# Patient Record
Sex: Male | Born: 1980 | Race: Black or African American | Hispanic: No | Marital: Single | Smoking: Current every day smoker
Health system: Southern US, Community
[De-identification: ages and names within clinical notes are randomized; demographics above are authoritative.]

## PROBLEM LIST (undated history)

## (undated) DIAGNOSIS — I10 Essential (primary) hypertension: Secondary | ICD-10-CM

## (undated) DIAGNOSIS — E119 Type 2 diabetes mellitus without complications: Secondary | ICD-10-CM

## (undated) HISTORY — PX: ANKLE SURGERY: SHX546

---

## 2007-07-12 ENCOUNTER — Emergency Department: Payer: Self-pay | Admitting: Emergency Medicine

## 2008-06-16 ENCOUNTER — Emergency Department: Payer: Self-pay | Admitting: Emergency Medicine

## 2008-09-26 ENCOUNTER — Encounter: Payer: Self-pay | Admitting: Orthopedic Surgery

## 2008-09-30 ENCOUNTER — Encounter: Payer: Self-pay | Admitting: Orthopedic Surgery

## 2008-10-30 ENCOUNTER — Encounter: Payer: Self-pay | Admitting: Orthopedic Surgery

## 2008-11-24 ENCOUNTER — Emergency Department: Payer: Self-pay | Admitting: Emergency Medicine

## 2008-11-30 ENCOUNTER — Encounter: Payer: Self-pay | Admitting: Orthopedic Surgery

## 2009-08-07 ENCOUNTER — Emergency Department: Payer: Self-pay | Admitting: Emergency Medicine

## 2010-09-30 ENCOUNTER — Emergency Department: Payer: Self-pay | Admitting: Emergency Medicine

## 2010-12-09 ENCOUNTER — Emergency Department: Payer: Self-pay | Admitting: Internal Medicine

## 2011-01-17 ENCOUNTER — Emergency Department: Payer: Self-pay | Admitting: Emergency Medicine

## 2011-01-18 ENCOUNTER — Emergency Department: Payer: Self-pay | Admitting: Emergency Medicine

## 2013-07-28 ENCOUNTER — Ambulatory Visit: Payer: Self-pay

## 2013-08-23 ENCOUNTER — Emergency Department: Payer: Self-pay | Admitting: Emergency Medicine

## 2015-06-03 ENCOUNTER — Emergency Department: Payer: Self-pay

## 2015-06-03 ENCOUNTER — Encounter: Payer: Self-pay | Admitting: Emergency Medicine

## 2015-06-03 ENCOUNTER — Emergency Department
Admission: EM | Admit: 2015-06-03 | Discharge: 2015-06-03 | Disposition: A | Discharge status: Home / Self Care | Payer: Self-pay | Attending: Emergency Medicine | Admitting: Emergency Medicine

## 2015-06-03 DIAGNOSIS — Z720 Tobacco use: Secondary | ICD-10-CM | POA: Insufficient documentation

## 2015-06-03 DIAGNOSIS — I10 Essential (primary) hypertension: Secondary | ICD-10-CM | POA: Insufficient documentation

## 2015-06-03 DIAGNOSIS — M25471 Effusion, right ankle: Secondary | ICD-10-CM | POA: Insufficient documentation

## 2015-06-03 DIAGNOSIS — M25571 Pain in right ankle and joints of right foot: Secondary | ICD-10-CM | POA: Insufficient documentation

## 2015-06-03 HISTORY — DX: Essential (primary) hypertension: I10

## 2015-06-03 MED ORDER — OXYCODONE-ACETAMINOPHEN 5-325 MG PO TABS
ORAL_TABLET | ORAL | Status: AC
  Administered 2015-06-03: 1 via ORAL
  Filled 2015-06-03: qty 1

## 2015-06-03 MED ORDER — OXYCODONE-ACETAMINOPHEN 5-325 MG PO TABS: 1.0000 | ORAL_TABLET | ORAL | Status: DC | PRN

## 2015-06-03 MED ORDER — IBUPROFEN 800 MG PO TABS: 800.0000 mg | ORAL_TABLET | Freq: Three times a day (TID) | ORAL | Status: DC | PRN

## 2015-06-03 MED ORDER — OXYCODONE-ACETAMINOPHEN 5-325 MG PO TABS
ORAL_TABLET | ORAL | Status: AC
  Filled 2015-06-03: qty 1

## 2015-06-03 MED ORDER — IBUPROFEN 800 MG PO TABS: 800.0000 mg | ORAL_TABLET | Freq: Once | ORAL | Status: DC

## 2015-06-03 MED ORDER — OXYCODONE-ACETAMINOPHEN 5-325 MG PO TABS
1.0000 | ORAL_TABLET | Freq: Once | ORAL | Status: AC
  Administered 2015-06-03: 1 via ORAL

## 2015-06-03 NOTE — ED Provider Notes (Signed)
[facility] Emergency Department Provider Note  ____________________________________________  Time seen: Approximately 6:50 AM  I have reviewed the triage vital signs and the nursing notes.   HISTORY  Chief Complaint Ankle Pain    HPI Lawrence Lea. is a 34 y.o. male who presents to the ED from home with 7/10 right ankle pain and swelling. Patient has a history of bilateral open ankle fractures s/p 13 surgeries overall. He states that he has occasional swelling with associated pain, especially with weather changes. Patient denies recent trauma/injury.   Past Medical History  Diagnosis Date  . Hypertension   No h/o gout  There are no active problems to display for this patient.   Past Surgical History  Procedure Laterality Date  . Ankle surgery      Bilateral    Current Outpatient Rx  Name  Route  Sig  Dispense  Refill  . ibuprofen (ADVIL,MOTRIN) 800 MG tablet   Oral   Take 1 tablet (800 mg total) by mouth every 8 (eight) hours as needed for moderate pain.   15 tablet   0   . oxyCODONE-acetaminophen (ROXICET) 5-325 MG per tablet   Oral   Take 1 tablet by mouth every 4 (four) hours as needed for severe pain.   15 tablet   0     Allergies Review of patient's allergies indicates no known allergies.  No family history on file.  Social History History  Substance Use Topics  . Smoking status: Current Some Day Smoker  . Smokeless tobacco: Not on file  . Alcohol Use: No    Review of Systems Constitutional: No fever/chills Eyes: No visual changes. ENT: No sore throat. Cardiovascular: Denies chest pain. Respiratory: Denies shortness of breath. Gastrointestinal: No abdominal pain.  No nausea, no vomiting.  No diarrhea.  No constipation. Genitourinary: Negative for dysuria. Musculoskeletal: Positive for right ankle pain and swelling. Negative for back pain. Skin: Negative for rash. Neurological: Negative for headaches, focal  weakness or numbness.  10-point ROS otherwise negative.  ____________________________________________   PHYSICAL EXAM:  VITAL SIGNS: ED Triage Vitals  Enc Vitals Group     BP 06/03/15 0410 160/73 mmHg     Pulse Rate 06/03/15 0410 88     Resp 06/03/15 0410 20     Temp 06/03/15 0410 98.6 F (37 C)     Temp Source 06/03/15 0410 Oral     SpO2 06/03/15 0410 99 %     Weight 06/03/15 0410 252 lb (114.306 kg)     Height 06/03/15 0410 6' (1.829 m)     Head Cir --      Peak Flow --      Pain Score 06/03/15 0410 10     Pain Loc --      Pain Edu? --      Excl. in GC? --     Constitutional: Alert and oriented. Well appearing and in no acute distress. Eyes: Conjunctivae are normal. PERRL. EOMI. Head: Atraumatic. Nose: No congestion/rhinnorhea. Mouth/Throat: Mucous membranes are moist.  Oropharynx non-erythematous. Neck: No stridor.   Cardiovascular: Normal rate, regular rhythm. Grossly normal heart sounds.  Good peripheral circulation. Respiratory: Normal respiratory effort.  No retractions. Lungs CTAB. Gastrointestinal: Soft and nontender. No distention. No abdominal bruits. No CVA tenderness. Musculoskeletal: Right lateral malleolus with mild swelling. Tender to palpation. Limited range of motion due to pain. There is no warmth or erythema over affected area. Pedal pulses intact. Brisk capillary refill.  Neurologic:  Normal speech  and language. No gross focal neurologic deficits are appreciated. Speech is normal. No gait instability. Skin:  Skin is warm, dry and intact. No rash noted. Psychiatric: Mood and affect are normal. Speech and behavior are normal.  ____________________________________________   LABS (all labs ordered are listed, but only abnormal results are displayed)  Labs Reviewed - No data to display ____________________________________________  EKG  None ____________________________________________  RADIOLOGY  Right ankle x-rays (viewed by me, interpreted  by Dr. Grace IsaacWatts): 1. Soft tissue swelling without acute osseous abnormality. 2. Distal fibula and syndesmosis fixation with tibiotalar arthrodesis. ____________________________________________   PROCEDURES  Procedure(s) performed: None  Critical Care performed: No  ____________________________________________   INITIAL IMPRESSION / ASSESSMENT AND PLAN / ED COURSE  Pertinent labs & imaging results that were available during my care of the patient were reviewed by me and considered in my medical decision making (see chart for details).  34 year old male who presents with right ankle pain and swelling without associated injury. Plan for Ace wrap, analgesia, crutches, RICE and follow-up orthopedics. Strict return precautions given. Patient verbalizes understanding and agrees with plan of care. ____________________________________________   FINAL CLINICAL IMPRESSION(S) / ED DIAGNOSES  Final diagnoses:  Pain and swelling of ankle, right      Irean HongJade J Sung, MD 06/03/15 0740

## 2015-06-03 NOTE — Discharge Instructions (Signed)
1. Take pain medicines as needed (Motrin/Percocet #15). 2. Elevate affected area and apply ice as needed for swelling. You may remove Ace wrap to bathe and sleep. 3. Use crutches as needed to walk. 4. Return to the ER for worsening symptoms, increased pain, swelling or other concerns.  Ankle Pain Ankle pain is a common symptom. The bones, cartilage, tendons, and muscles of the ankle joint perform a lot of work each day. The ankle joint holds your body weight and allows you to move around. Ankle pain can occur on either side or back of 1 or both ankles. Ankle pain may be sharp and burning or dull and aching. There may be tenderness, stiffness, redness, or warmth around the ankle. The pain occurs more often when a person walks or puts pressure on the ankle. CAUSES  There are many reasons ankle pain can develop. It is important to work with your caregiver to identify the cause since many conditions can impact the bones, cartilage, muscles, and tendons. Causes for ankle pain include:  Injury, including a break (fracture), sprain, or strain often due to a fall, sports, or a high-impact activity.  Swelling (inflammation) of a tendon (tendonitis).  Achilles tendon rupture.  Ankle instability after repeated sprains and strains.  Poor foot alignment.  Pressure on a nerve (tarsal tunnel syndrome).  Arthritis in the ankle or the lining of the ankle.  Crystal formation in the ankle (gout or pseudogout). DIAGNOSIS  A diagnosis is based on your medical history, your symptoms, results of your physical exam, and results of diagnostic tests. Diagnostic tests may include X-ray exams or a computerized magnetic scan (magnetic resonance imaging, MRI). TREATMENT  Treatment will depend on the cause of your ankle pain and may include:  Keeping pressure off the ankle and limiting activities.  Using crutches or other walking support (a cane or brace).  Using rest, ice, compression, and  elevation.  Participating in physical therapy or home exercises.  Wearing shoe inserts or special shoes.  Losing weight.  Taking medications to reduce pain or swelling or receiving an injection.  Undergoing surgery. HOME CARE INSTRUCTIONS   Only take over-the-counter or prescription medicines for pain, discomfort, or fever as directed by your caregiver.  Put ice on the injured area.  Put ice in a plastic bag.  Place a towel between your skin and the bag.  Leave the ice on for 15-20 minutes at a time, 03-04 times a day.  Keep your leg raised (elevated) when possible to lessen swelling.  Avoid activities that cause ankle pain.  Follow specific exercises as directed by your caregiver.  Record how often you have ankle pain, the location of the pain, and what it feels like. This information may be helpful to you and your caregiver.  Ask your caregiver about returning to work or sports and whether you should drive.  Follow up with your caregiver for further examination, therapy, or testing as directed. SEEK MEDICAL CARE IF:   Pain or swelling continues or worsens beyond 1 week.  You have an oral temperature above 102 F (38.9 C).  You are feeling unwell or have chills.  You are having an increasingly difficult time with walking.  You have loss of sensation or other new symptoms.  You have questions or concerns. MAKE SURE YOU:   Understand these instructions.  Will watch your condition.  Will get help right away if you are not doing well or get worse. Document Released: 05/06/2010 Document Revised: 02/08/2012 Document Reviewed:  05/06/2010 [employer] Patient Information 2015 [place], [place]. This information is not intended to replace advice given to you by your health care provider. Make sure you discuss any questions you have with your health care provider.

## 2015-06-03 NOTE — ED Notes (Signed)
Patient reports history of swelling and pain to bilateral ankles after "shattering" both ankles in a fall several years prior.  Patient reports pain and swelling to left ankle has been unbearable over the past several days.  Swelling noted to left ankle, good positive pedal pulse to left foot.

## 2015-06-03 NOTE — ED Notes (Signed)
Pt states he has hx of leg fx and will occasionally have swelling to right leg. Pt states worse pain and swelling over last few days, denies injury. pt ambulatory.

## 2015-07-01 ENCOUNTER — Encounter: Payer: Self-pay | Admitting: Emergency Medicine

## 2015-07-01 ENCOUNTER — Emergency Department
Admission: EM | Admit: 2015-07-01 | Discharge: 2015-07-01 | Disposition: A | Discharge status: Home / Self Care | Payer: BLUE CROSS/BLUE SHIELD | Attending: Emergency Medicine | Admitting: Emergency Medicine

## 2015-07-01 ENCOUNTER — Emergency Department: Payer: BLUE CROSS/BLUE SHIELD

## 2015-07-01 DIAGNOSIS — L03115 Cellulitis of right lower limb: Secondary | ICD-10-CM | POA: Diagnosis not present

## 2015-07-01 DIAGNOSIS — I10 Essential (primary) hypertension: Secondary | ICD-10-CM | POA: Insufficient documentation

## 2015-07-01 DIAGNOSIS — L02415 Cutaneous abscess of right lower limb: Secondary | ICD-10-CM | POA: Diagnosis present

## 2015-07-01 DIAGNOSIS — Z87891 Personal history of nicotine dependence: Secondary | ICD-10-CM | POA: Diagnosis not present

## 2015-07-01 DIAGNOSIS — L0291 Cutaneous abscess, unspecified: Secondary | ICD-10-CM

## 2015-07-01 LAB — BASIC METABOLIC PANEL
Anion gap: 11 (ref 5–15)
BUN: 11 mg/dL (ref 6–20)
CO2: 25 mmol/L (ref 22–32)
CREATININE: 1.02 mg/dL (ref 0.61–1.24)
Calcium: 9.3 mg/dL (ref 8.9–10.3)
Chloride: 99 mmol/L — ABNORMAL LOW (ref 101–111)
GFR calc Af Amer: >60 mL/min (ref >60)
GFR calc non Af Amer: >60 mL/min (ref >60)
GLUCOSE: 333 mg/dL — AB (ref 65–99)
Potassium: 3.9 mmol/L (ref 3.5–5.1)
Sodium: 135 mmol/L (ref 135–145)

## 2015-07-01 LAB — CBC
HCT: 43.9 % (ref 40.0–52.0)
Hemoglobin: 14.6 g/dL (ref 13.0–18.0)
MCH: 29.4 pg (ref 26.0–34.0)
MCHC: 33.2 g/dL (ref 32.0–36.0)
MCV: 88.6 fL (ref 80.0–100.0)
Platelets: 225 10*3/uL (ref 150–440)
RBC: 4.96 MIL/uL (ref 4.40–5.90)
RDW: 13.6 % (ref 11.5–14.5)
WBC: 9.5 10*3/uL (ref 3.8–10.6)

## 2015-07-01 LAB — SEDIMENTATION RATE: Sed Rate: 11 mm/hr (ref 0–15)

## 2015-07-01 MED ORDER — CLINDAMYCIN HCL 150 MG PO CAPS
300.0000 mg | ORAL_CAPSULE | Freq: Once | ORAL | Status: AC
  Administered 2015-07-01: 300 mg via ORAL
  Filled 2015-07-01: qty 2

## 2015-07-01 MED ORDER — CLINDAMYCIN HCL 300 MG PO CAPS: 300.0000 mg | ORAL_CAPSULE | Freq: Three times a day (TID) | ORAL | Status: AC

## 2015-07-01 MED ORDER — OXYCODONE-ACETAMINOPHEN 5-325 MG PO TABS: 1.0000 | ORAL_TABLET | Freq: Four times a day (QID) | ORAL | Status: AC | PRN

## 2015-07-01 NOTE — ED Notes (Signed)
Patient with abscess to right ankle. Patient states that started 3 days ago and has become larger. Area warm to touch.

## 2015-07-01 NOTE — ED Provider Notes (Signed)
[facility] Emergency Department Provider Note  ____________________________________________  Time seen: Approximately 600 AM  I have reviewed the triage vital signs and the nursing notes.   HISTORY  Chief Complaint Abscess    HPI Lawrence Richards. is a 34 y.o. male who reports that he had an abscess show up on his right lateral ankle approximately 3 days ago. The patient reports that he had surgery on his bilateral ankles and 2009. He denies any injury or trauma. He reports that it just started having some pain and swelling. He reports the pain as a 10 out of 10 in intensity. The patient reports that this is been going on for 3 days and he is unable to tolerate any more. He has not taken anything else for pain at home. He does have a mild headache.   Past Medical History  Diagnosis Date  . Hypertension     There are no active problems to display for this patient.   Past Surgical History  Procedure Laterality Date  . Ankle surgery      Bilateral    Current Outpatient Rx  Name  Route  Sig  Dispense  Refill  . clindamycin (CLEOCIN) 300 MG capsule   Oral   Take 1 capsule (300 mg total) by mouth 3 (three) times daily.   30 capsule   0   . oxyCODONE-acetaminophen (ROXICET) 5-325 MG per tablet   Oral   Take 1 tablet by mouth every 6 (six) hours as needed.   12 tablet   0     Allergies Review of patient's allergies indicates no known allergies.  History reviewed. No pertinent family history.  Social History History  Substance Use Topics  . Smoking status: Former Games developer  . Smokeless tobacco: Not on file  . Alcohol Use: No    Review of Systems Constitutional: No fever/chills Eyes: No visual changes. ENT: No sore throat. Cardiovascular: Denies chest pain. Respiratory: Denies shortness of breath. Gastrointestinal: No abdominal pain.  No nausea, no vomiting.  No diarrhea.  No constipation. Genitourinary: Negative for  dysuria. Musculoskeletal:right ankle pain Skin: erythema to right ankle and swelling to right lateral ankle Neurological: Negative for headaches, focal weakness or numbness.  10-point ROS otherwise negative.  ____________________________________________   PHYSICAL EXAM:  VITAL SIGNS: ED Triage Vitals  Enc Vitals Group     BP 07/01/15 0116 149/95 mmHg     Pulse Rate 07/01/15 0116 108     Resp 07/01/15 0116 18     Temp 07/01/15 0116 97.9 F (36.6 C)     Temp Source 07/01/15 0116 Oral     SpO2 07/01/15 0116 97 %     Weight 07/01/15 0116 253 lb (114.76 kg)     Height 07/01/15 0116 5\' 11"  (1.803 m)     Head Cir --      Peak Flow --      Pain Score 07/01/15 0117 9     Pain Loc --      Pain Edu? --      Excl. in GC? --     Constitutional: Alert and oriented. Well appearing and in mild distress. Eyes: Conjunctivae are normal. PERRL. EOMI. Head: Atraumatic. Nose: No congestion/rhinnorhea. Mouth/Throat: Mucous membranes are moist.  Oropharynx non-erythematous. Cardiovascular: Normal rate, regular rhythm. Grossly normal heart sounds.  Good peripheral circulation. Respiratory: Normal respiratory effort.  No retractions. Lungs CTAB. Gastrointestinal: Soft and nontender. No distention. Positive bowel sounds Genitourinary: deferred Musculoskeletal: No lower extremity tenderness nor  edema.  No joint effusions. Neurologic:  Normal speech and language. No gross focal neurologic deficits are appreciated. No gait instability. Skin:  Erythema and swelling to right lateral ankle Psychiatric: Mood and affect are normal.   ____________________________________________   LABS (all labs ordered are listed, but only abnormal results are displayed)  Labs Reviewed  CBC  BASIC METABOLIC PANEL  SEDIMENTATION RATE   ____________________________________________  EKG  none ____________________________________________  RADIOLOGY  Ankle xray: solidly ossified right ankle arthrodesis, no  evidence of hardware failure, no fracture or dislocation ____________________________________________   PROCEDURES  Procedure(s) performed: None  Critical Care performed: No  ____________________________________________   INITIAL IMPRESSION / ASSESSMENT AND PLAN / ED COURSE  Pertinent labs & imaging results that were available during my care of the patient were reviewed by me and considered in my medical decision making (see chart for details).  This is a 34 year old male who comes in with some swelling to his right lateral ankle with a concern for possible abscess. There is an area of fluctuance but it is directly over the scar where the patient has his hardware. I will do some labs and contact orthopedic surgery.  I spoke with Dr. Lesleigh Noe who felt that the patient could be treated as a typical cellulitis with antibiotics and he should know to follow up if things are not getting better or return to the emergency department for further evaluation. I will treat the patient with clindamycin and discharge the patient home with the caveat that he will follow-up with either orthopedic surgery or return to the emergency department in 2 days should the symptoms not be improving.  Given the location of the area of fluctuance over his previous surgical scar I will not perform an incision and drainage as the concern is I am unsure how deep the area does and if it will have some connection with his joints or near his hardware. Dr. Lesleigh Noe did agree not to I&D the area and to have him follow up or return. ____________________________________________   FINAL CLINICAL IMPRESSION(S) / ED DIAGNOSES  Final diagnoses:  Cellulitis of right lower extremity  Abscess      Rebecka Apley, MD 07/01/15 [phone]

## 2015-07-01 NOTE — ED Notes (Signed)
Pt. States he noticed an abscess on lateral part of rt. Ankle.  He has swelling the circumference of rt. Ankle. Pt. Also has swelling to the left ankle.  Pt. States in 2009 had multiple surgeries on both ankles and feet due to a fall from building.

## 2015-07-01 NOTE — Discharge Instructions (Signed)

## 2015-07-06 ENCOUNTER — Emergency Department
Admission: EM | Admit: 2015-07-06 | Discharge: 2015-07-06 | Disposition: A | Discharge status: Home / Self Care | Payer: BLUE CROSS/BLUE SHIELD | Attending: Emergency Medicine | Admitting: Emergency Medicine

## 2015-07-06 DIAGNOSIS — M25571 Pain in right ankle and joints of right foot: Secondary | ICD-10-CM | POA: Insufficient documentation

## 2015-07-06 DIAGNOSIS — I10 Essential (primary) hypertension: Secondary | ICD-10-CM | POA: Diagnosis not present

## 2015-07-06 DIAGNOSIS — Z792 Long term (current) use of antibiotics: Secondary | ICD-10-CM | POA: Insufficient documentation

## 2015-07-06 DIAGNOSIS — Z87891 Personal history of nicotine dependence: Secondary | ICD-10-CM | POA: Diagnosis not present

## 2015-07-06 LAB — CBC WITH DIFFERENTIAL/PLATELET
Basophils Absolute: 0.1 10*3/uL (ref 0–0.1)
Basophils Relative: 1 %
EOS ABS: 0.2 10*3/uL (ref 0–0.7)
Eosinophils Relative: 1 %
HCT: 39.8 % — ABNORMAL LOW (ref 40.0–52.0)
Hemoglobin: 13.5 g/dL (ref 13.0–18.0)
LYMPHS PCT: 34 %
Lymphs Abs: 3.8 10*3/uL — ABNORMAL HIGH (ref 1.0–3.6)
MCH: 30.2 pg (ref 26.0–34.0)
MCHC: 34 g/dL (ref 32.0–36.0)
MCV: 88.8 fL (ref 80.0–100.0)
MONOS PCT: 10 %
Monocytes Absolute: 1.1 10*3/uL — ABNORMAL HIGH (ref 0.2–1.0)
NEUTROS ABS: 6.2 10*3/uL (ref 1.4–6.5)
Neutrophils Relative %: 54 %
Platelets: 189 10*3/uL (ref 150–440)
RBC: 4.48 MIL/uL (ref 4.40–5.90)
RDW: 13.6 % (ref 11.5–14.5)
WBC: 11.3 10*3/uL — ABNORMAL HIGH (ref 3.8–10.6)

## 2015-07-06 LAB — SEDIMENTATION RATE: SED RATE: 12 mm/h (ref 0–15)

## 2015-07-06 LAB — BASIC METABOLIC PANEL
Anion gap: 8 (ref 5–15)
BUN: 14 mg/dL (ref 6–20)
CHLORIDE: 98 mmol/L — AB (ref 101–111)
CO2: 26 mmol/L (ref 22–32)
Calcium: 8.5 mg/dL — ABNORMAL LOW (ref 8.9–10.3)
Creatinine, Ser: 1.08 mg/dL (ref 0.61–1.24)
GFR calc Af Amer: >60 mL/min (ref >60)
GFR calc non Af Amer: >60 mL/min (ref >60)
Glucose, Bld: 464 mg/dL — ABNORMAL HIGH (ref 65–99)
POTASSIUM: 3.8 mmol/L (ref 3.5–5.1)
SODIUM: 132 mmol/L — AB (ref 135–145)

## 2015-07-06 MED ORDER — IBUPROFEN 600 MG PO TABS
600.0000 mg | ORAL_TABLET | Freq: Once | ORAL | Status: AC
  Administered 2015-07-06: 600 mg via ORAL

## 2015-07-06 MED ORDER — IBUPROFEN 600 MG PO TABS
ORAL_TABLET | ORAL | Status: AC
  Administered 2015-07-06: 600 mg via ORAL
  Filled 2015-07-06: qty 1

## 2015-07-06 NOTE — ED Notes (Signed)
Patient reports right ankle pain, denies any injury.  Patient reports chronic pain and swelling in ankles from previous fractures.

## 2015-07-06 NOTE — Discharge Instructions (Signed)
Please seek medical attention for any high fevers, chest pain, shortness of breath, change in behavior, persistent vomiting, bloody stool or any other new or concerning symptoms.  Ankle Pain Ankle pain is a common symptom. The bones, cartilage, tendons, and muscles of the ankle joint perform a lot of work each day. The ankle joint holds your body weight and allows you to move around. Ankle pain can occur on either side or back of 1 or both ankles. Ankle pain may be sharp and burning or dull and aching. There may be tenderness, stiffness, redness, or warmth around the ankle. The pain occurs more often when a person walks or puts pressure on the ankle. CAUSES  There are many reasons ankle pain can develop. It is important to work with your caregiver to identify the cause since many conditions can impact the bones, cartilage, muscles, and tendons. Causes for ankle pain include:  Injury, including a break (fracture), sprain, or strain often due to a fall, sports, or a high-impact activity.  Swelling (inflammation) of a tendon (tendonitis).  Achilles tendon rupture.  Ankle instability after repeated sprains and strains.  Poor foot alignment.  Pressure on a nerve (tarsal tunnel syndrome).  Arthritis in the ankle or the lining of the ankle.  Crystal formation in the ankle (gout or pseudogout). DIAGNOSIS  A diagnosis is based on your medical history, your symptoms, results of your physical exam, and results of diagnostic tests. Diagnostic tests may include X-ray exams or a computerized magnetic scan (magnetic resonance imaging, MRI). TREATMENT  Treatment will depend on the cause of your ankle pain and may include:  Keeping pressure off the ankle and limiting activities.  Using crutches or other walking support (a cane or brace).  Using rest, ice, compression, and elevation.  Participating in physical therapy or home exercises.  Wearing shoe inserts or special shoes.  Losing  weight.  Taking medications to reduce pain or swelling or receiving an injection.  Undergoing surgery. HOME CARE INSTRUCTIONS   Only take over-the-counter or prescription medicines for pain, discomfort, or fever as directed by your caregiver.  Put ice on the injured area.  Put ice in a plastic bag.  Place a towel between your skin and the bag.  Leave the ice on for 15-20 minutes at a time, 03-04 times a day.  Keep your leg raised (elevated) when possible to lessen swelling.  Avoid activities that cause ankle pain.  Follow specific exercises as directed by your caregiver.  Record how often you have ankle pain, the location of the pain, and what it feels like. This information may be helpful to you and your caregiver.  Ask your caregiver about returning to work or sports and whether you should drive.  Follow up with your caregiver for further examination, therapy, or testing as directed. SEEK MEDICAL CARE IF:   Pain or swelling continues or worsens beyond 1 week.  You have an oral temperature above 102 F (38.9 C).  You are feeling unwell or have chills.  You are having an increasingly difficult time with walking.  You have loss of sensation or other new symptoms.  You have questions or concerns. MAKE SURE YOU:   Understand these instructions.  Will watch your condition.  Will get help right away if you are not doing well or get worse. Document Released: 05/06/2010 Document Revised: 02/08/2012 Document Reviewed: 05/06/2010 [facility] Patient Information 2015 [place], [place]. This information is not intended to replace advice given to you by your health care  provider. Make sure you discuss any questions you have with your health care provider. ° °

## 2015-07-06 NOTE — ED Provider Notes (Signed)
[facility] Emergency Department Provider Note    ____________________________________________  Time seen: 34  I have reviewed the triage vital signs and the nursing notes.   HISTORY  Chief Complaint Ankle Pain   History limited by: Not Limited   HPI Lawrence Richards. is a 34 y.o. male who presents to the emergency department today because of concern for continued right ankle pain. The patient was seen in the emergency department roughly 5 days ago. At that point he was found to have some redness and swelling over the lateral aspect of his right ankle. He does have hardware in that ankle after ankle surgery [facility]. At that time he was treated with  antibiotics. He stated since that time had a little bit of drainage from the wound. He does complain of continued pain and swelling. Denies any fevers vomiting and nausea.     Past Medical History  Diagnosis Date  . Hypertension     There are no active problems to display for this patient.   Past Surgical History  Procedure Laterality Date  . Ankle surgery      Bilateral    Current Outpatient Rx  Name  Route  Sig  Dispense  Refill  . clindamycin (CLEOCIN) 300 MG capsule   Oral   Take 1 capsule (300 mg total) by mouth 3 (three) times daily.   30 capsule   0   . oxyCODONE-acetaminophen (ROXICET) 5-325 MG per tablet   Oral   Take 1 tablet by mouth every 6 (six) hours as needed.   12 tablet   0     Allergies Review of patient's allergies indicates no known allergies.  No family history on file.  Social History History  Substance Use Topics  . Smoking status: Former Games developer  . Smokeless tobacco: Not on file  . Alcohol Use: No    Review of Systems  Constitutional: Negative for fever. Cardiovascular: Negative for chest pain. Respiratory: Negative for shortness of breath. Gastrointestinal: Negative for abdominal pain, vomiting and diarrhea. Genitourinary: Negative for  dysuria. Musculoskeletal: Negative for back pain. Skin: Negative for rash. Neurological: Negative for headaches, focal weakness or numbness.  10-point ROS otherwise negative.  ____________________________________________   PHYSICAL EXAM:  VITAL SIGNS: ED Triage Vitals  Enc Vitals Group     BP 07/06/15 0111 142/88 mmHg     Pulse Rate 07/06/15 0111 90     Resp 07/06/15 0111 20     Temp 07/06/15 0111 98.3 F (36.8 C)     Temp Source 07/06/15 0111 Oral     SpO2 07/06/15 0111 97 %     Weight 07/06/15 0111 252 lb (114.306 kg)     Height 07/06/15 0111  (1.803 m)     Head Cir --      Peak Flow --      Pain Score 07/06/15 0529 10   Constitutional: Alert and oriented. Well appearing and in no distress. Eyes: Conjunctivae are normal. PERRL. Normal extraocular movements. ENT   Head: Normocephalic and atraumatic.   Nose: No congestion/rhinnorhea.   Mouth/Throat: Mucous membranes are moist.   Neck: No stridor. Hematological/Lymphatic/Immunilogical: No cervical lymphadenopathy. Cardiovascular: Normal rate, regular rhythm.  No murmurs, rubs, or gallops. Respiratory: Normal respiratory effort without tachypnea nor retractions. Breath sounds are clear and equal bilaterally. No wheezes/rales/rhonchi. Gastrointestinal: Soft and nontender. No distention.  Genitourinary: Deferred Musculoskeletal: right lateral ankle with small area of erythema and evidence of recent discharge. Ankle itself without any apparent swelling.  No erythema or any other site around the joint. Minimal tenderness with passive range of motion. Neurovascularly intact distally. Neurologic:  Normal speech and language. No gross focal neurologic deficits are appreciated. Speech is normal.  Skin:  Skin is warm, dry and intact. No rash noted. Psychiatric: Mood and affect are normal. Speech and behavior are normal. Patient exhibits appropriate insight and  judgment.  ____________________________________________    LABS (pertinent positives/negatives)  Labs Reviewed  CBC WITH DIFFERENTIAL/PLATELET - Abnormal; Notable for the following:    WBC 11.3 (*)    HCT 39.8 (*)    Lymphs Abs 3.8 (*)    Monocytes Absolute 1.1 (*)    All other components within normal limits  BASIC METABOLIC PANEL - Abnormal; Notable for the following:    Sodium 132 (*)    Chloride 98 (*)    Glucose, Bld 464 (*)    Calcium 8.5 (*)    All other components within normal limits  SEDIMENTATION RATE     ____________________________________________   EKG  None  ____________________________________________    RADIOLOGY  None  ____________________________________________   PROCEDURES  Procedure(s) performed: None  Critical Care performed: No  ____________________________________________   INITIAL IMPRESSION / ASSESSMENT AND PLAN / ED COURSE  Pertinent labs & imaging results that were available during my care of the patient were reviewed by me and considered in my medical decision making (see chart for details).  Patient presents to the emergency department with continued right ankle pain. On exam patient has some erythema to the lateral aspect of the ankle. The rest of the ankle joint however without any erythema. He has minimal tenderness to passive range of motion. This point I have low suspicion for septic arthritis. Additionally patient does have hardware in that ankle. I did talk to on call [facility] orthopedic resident and they will try to arrange patient for an appointment next week. Discussed return precautions with the patient.  ____________________________________________   FINAL CLINICAL IMPRESSION(S) / ED DIAGNOSES  Final diagnoses:  Ankle pain, right     [facility]ine[facility] Semen, MD 07/06/15 [phone]

## 2015-07-12 ENCOUNTER — Emergency Department: Payer: BLUE CROSS/BLUE SHIELD

## 2015-07-12 ENCOUNTER — Emergency Department
Admission: EM | Admit: 2015-07-12 | Discharge: 2015-07-12 | Disposition: A | Discharge status: Home / Self Care | Payer: BLUE CROSS/BLUE SHIELD | Attending: Emergency Medicine | Admitting: Emergency Medicine

## 2015-07-12 ENCOUNTER — Encounter: Payer: Self-pay | Admitting: Emergency Medicine

## 2015-07-12 DIAGNOSIS — R739 Hyperglycemia, unspecified: Secondary | ICD-10-CM

## 2015-07-12 DIAGNOSIS — E1165 Type 2 diabetes mellitus with hyperglycemia: Secondary | ICD-10-CM | POA: Diagnosis present

## 2015-07-12 DIAGNOSIS — I10 Essential (primary) hypertension: Secondary | ICD-10-CM | POA: Diagnosis not present

## 2015-07-12 DIAGNOSIS — X58XXXD Exposure to other specified factors, subsequent encounter: Secondary | ICD-10-CM | POA: Diagnosis not present

## 2015-07-12 DIAGNOSIS — Z792 Long term (current) use of antibiotics: Secondary | ICD-10-CM | POA: Insufficient documentation

## 2015-07-12 DIAGNOSIS — Z87891 Personal history of nicotine dependence: Secondary | ICD-10-CM | POA: Diagnosis not present

## 2015-07-12 DIAGNOSIS — S91001D Unspecified open wound, right ankle, subsequent encounter: Secondary | ICD-10-CM | POA: Insufficient documentation

## 2015-07-12 HISTORY — DX: Type 2 diabetes mellitus without complications: E11.9

## 2015-07-12 LAB — BASIC METABOLIC PANEL
Anion gap: 14 (ref 5–15)
BUN: 15 mg/dL (ref 6–20)
CHLORIDE: 95 mmol/L — AB (ref 101–111)
CO2: 21 mmol/L — ABNORMAL LOW (ref 22–32)
CREATININE: 1.22 mg/dL (ref 0.61–1.24)
Calcium: 9.2 mg/dL (ref 8.9–10.3)
GFR calc Af Amer: >60 mL/min (ref >60)
GFR calc non Af Amer: >60 mL/min (ref >60)
Glucose, Bld: 466 mg/dL — ABNORMAL HIGH (ref 65–99)
Potassium: 3.9 mmol/L (ref 3.5–5.1)
Sodium: 130 mmol/L — ABNORMAL LOW (ref 135–145)

## 2015-07-12 LAB — GLUCOSE, CAPILLARY
GLUCOSE-CAPILLARY: 462 mg/dL — AB (ref 65–99)
GLUCOSE-CAPILLARY: 495 mg/dL — AB (ref 65–99)
GLUCOSE-CAPILLARY: 372 mg/dL — AB (ref 65–99)

## 2015-07-12 LAB — URINALYSIS COMPLETE WITH MICROSCOPIC (ARMC ONLY)
BACTERIA UA: NONE SEEN
Bilirubin Urine: NEGATIVE
Hgb urine dipstick: NEGATIVE
Leukocytes, UA: NEGATIVE
Nitrite: NEGATIVE
Protein, ur: NEGATIVE mg/dL
SPECIFIC GRAVITY, URINE: 1.031 — AB (ref 1.005–1.030)
Squamous Epithelial / LPF: NONE SEEN
pH: 5 (ref 5.0–8.0)

## 2015-07-12 LAB — CBC WITH DIFFERENTIAL/PLATELET
Basophils Absolute: 0.1 10*3/uL (ref 0–0.1)
Basophils Relative: 1 %
EOS PCT: 1 %
Eosinophils Absolute: 0.1 10*3/uL (ref 0–0.7)
HCT: 43.9 % (ref 40.0–52.0)
Hemoglobin: 15.1 g/dL (ref 13.0–18.0)
LYMPHS ABS: 2.4 10*3/uL (ref 1.0–3.6)
Lymphocytes Relative: 25 %
MCH: 29.9 pg (ref 26.0–34.0)
MCHC: 34.3 g/dL (ref 32.0–36.0)
MCV: 87 fL (ref 80.0–100.0)
Monocytes Absolute: 0.8 10*3/uL (ref 0.2–1.0)
Monocytes Relative: 9 %
Neutro Abs: 6.2 10*3/uL (ref 1.4–6.5)
Neutrophils Relative %: 64 %
Platelets: 215 10*3/uL (ref 150–440)
RBC: 5.04 MIL/uL (ref 4.40–5.90)
RDW: 13.6 % (ref 11.5–14.5)
WBC: 9.7 10*3/uL (ref 3.8–10.6)

## 2015-07-12 LAB — SEDIMENTATION RATE: Sed Rate: 13 mm/hr (ref 0–15)

## 2015-07-12 MED ORDER — INSULIN ASPART 100 UNIT/ML ~~LOC~~ SOLN
10.0000 [IU] | Freq: Once | SUBCUTANEOUS | Status: AC
  Administered 2015-07-12: 10 [IU] via INTRAVENOUS
  Filled 2015-07-12: qty 10

## 2015-07-12 MED ORDER — KETOROLAC TROMETHAMINE 30 MG/ML IJ SOLN
30.0000 mg | Freq: Once | INTRAMUSCULAR | Status: AC
  Administered 2015-07-12: 30 mg via INTRAVENOUS
  Filled 2015-07-12: qty 1

## 2015-07-12 MED ORDER — SODIUM CHLORIDE 0.9 % IV BOLUS (SEPSIS)
1000.0000 mL | Freq: Once | INTRAVENOUS | Status: AC
  Administered 2015-07-12: 1000 mL via INTRAVENOUS

## 2015-07-12 MED ORDER — CEPHALEXIN 500 MG PO CAPS
500.0000 mg | ORAL_CAPSULE | Freq: Once | ORAL | Status: AC
  Administered 2015-07-12: 500 mg via ORAL
  Filled 2015-07-12: qty 1

## 2015-07-12 NOTE — ED Provider Notes (Signed)
[facility] Emergency Department Provider Note ____________________________________________  Time seen: Approximately 5:42 PM  I have reviewed the triage vital signs and the nursing notes.   HISTORY  Chief Complaint Hyperglycemia    HPI Lawrence Richards. is a 34 y.o. male with newly diagnosed diabetes who started metformin yesterday and presents today because his blood sugar was 500 at home and he was having blurred vision. He has also been seen in the ER several times for pain and drainage of his right lateral ankle that has been previously repaired with hardware. He was started on clindamycin on 8/1 and has not yet completed that course. [facility] orthopedics was contacted on 8/6 and stated they would try to arrange a follow-up but patient states he has yet to hear from them. He has been seen at [facility] for his blood sugars and is scheduled for diabetic education on September 8. This being in the ER and receiving IV fluids he states his vision is improving. He states the ankle drainage has somewhat gone down but is still present; the pain is also still present but has increased somewhat.  He denies any fevers. No chest pain, difficulty breathing, abdominal pain, vomiting.Endorses increased thirst and urination.  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  Past Medical History  Diagnosis Date  . Hypertension   . Diabetes mellitus without complication     There are no active problems to display for this patient.   Past Surgical History  Procedure Laterality Date  . Ankle surgery      Bilateral    Current Outpatient Rx  Name  Route  Sig  Dispense  Refill  . clindamycin (CLEOCIN) 300 MG capsule   Oral   Take 1 capsule (300 mg total) by mouth 3 (three) times daily.   30 capsule   0   . oxyCODONE-acetaminophen (ROXICET) 5-325 MG per tablet   Oral   Take 1 tablet by  mouth every 6 (six) hours as needed.   12 tablet   0     Allergies Review of patient's allergies indicates no known allergies.  History reviewed. No pertinent family history.  Social History Social History  Substance Use Topics  . Smoking status: Former Games developer  . Smokeless tobacco: None  . Alcohol Use: No    Review of Systems Constitutional: No fever Cardiovascular: Denies chest pain. Respiratory: Denies shortness of breath. Gastrointestinal: No abdominal pain.  No nausea, no vomiting.  No diarrhea.   Endocrine:No recent weight change 10-point ROS otherwise negative.  ____________________________________________   PHYSICAL EXAM:  VITAL SIGNS: ED Triage Vitals  Enc Vitals Group     BP 07/12/15 1549 142/82 mmHg     Pulse Rate 07/12/15 1549 111     Resp 07/12/15 1549 20     Temp 07/12/15 1549 98.2 F (36.8 C)     Temp Source 07/12/15 1549 Oral     SpO2 07/12/15 1549 98 %     Weight 07/12/15 1549 180 lb (81.647 kg)     Height 07/12/15 1549 5\' 11"  (1.803 m)     Head Cir --      Peak Flow --      Pain Score 07/12/15 1550 0     Pain Loc --      Pain Edu? --      Excl. in GC? --    Constitutional: Alert and  oriented. Well appearing and in no acute distress. Eyes: Conjunctivae are normal. PERRL. EOMI. Head: Atraumatic. Nose: No congestion/rhinnorhea. Mouth/Throat: Mucous membranes are moist.  Oropharynx non-erythematous. Neck: No stridor.   Lymphatic: No cervical lymphadenopathy. Cardiovascular: Normal rate, regular rhythm. Grossly normal heart sounds.  Peripheral pulses 2+ B Respiratory: Normal respiratory effort.  No retractions. Lungs CTAB. Gastrointestinal: Soft and nontender. No distention. Normal bowel sounds.  Musculoskeletal: Right lateral malleolus with punctate area that has a tiny bit of purulence. There is no underlying fluctuance or swelling. There is no surrounding erythema. Significant scarring over medial malleolus. No lower extremity edema.  No  calf TTP. Neurologic:  Normal speech and language. No gross focal neurologic deficits are appreciated. Speech is normal.  Skin:  Skin is warm, dry and intact. No rash noted. Psychiatric: Mood and affect are normal. Speech and behavior are normal.  ____________________________________________   LABS (all labs ordered are listed, but only abnormal results are displayed)  Labs Reviewed  GLUCOSE, CAPILLARY - Abnormal; Notable for the following:    Glucose-Capillary 495 (*)    All other components within normal limits  GLUCOSE, CAPILLARY - Abnormal; Notable for the following:    Glucose-Capillary 462 (*)    All other components within normal limits  BASIC METABOLIC PANEL - Abnormal; Notable for the following:    Sodium 130 (*)    Chloride 95 (*)    CO2 21 (*)    Glucose, Bld 466 (*)    All other components within normal limits  URINALYSIS COMPLETEWITH MICROSCOPIC ([facility] ONLY) - Abnormal; Notable for the following:    Color, Urine STRAW (*)    APPearance CLEAR (*)    Glucose, UA >500 (*)    Ketones, ur 2+ (*)    Specific Gravity, Urine 1.031 (*)    All other components within normal limits  WOUND CULTURE  CBC WITH DIFFERENTIAL/PLATELET  SEDIMENTATION RATE   ____________________________________________  RADIOLOGY  Ankle xr-IMPRESSION: No acute fracture or subluxation. Stable postsurgical changes. Degenerative changes talocalcaneal joint.   ____________________________________________   PROCEDURES  Procedure(s) performed: none  Critical Care performed: none ____________________________________________   INITIAL IMPRESSION / ASSESSMENT AND PLAN / ED COURSE  Pertinent labs & imaging results that were available during my care of the patient were reviewed by me and considered in my medical decision making (see chart for details).  Repeat sedimentation rate and x-ray today.  ----------------------------------------- 6:58 PM on  07/12/2015 -----------------------------------------  I discussed with Dr. Towana Badger, [facility] orthopedics who will arrange for patient to be seen in orthopedics clinic for follow-up. As patient's sedimentation rate has not elevated over the course of his treatment and the area appears to be improving, I will advise him to finish his course of clindamycin and let orthopedics decide if they want to extend it.  Sugar now 360 ____________________________________________   FINAL CLINICAL IMPRESSION(S) / ED DIAGNOSES  Hyperglycemia, right lateral malleolus wound    Maurilio Lovely, MD 07/12/15 1919

## 2015-07-12 NOTE — ED Notes (Signed)
[employer] report [id number] [phone]

## 2015-07-12 NOTE — Discharge Instructions (Signed)
Go to [facility] orthopedics on Monday for follow-up of your ankle wound. Call first to find out if they have walk-in hours or if they have are arranged your appointment.  [phone].  Call [facility] on Monday to see if they can move up your appointment for your diabetic education. Return to the emergency department for new or worsening symptoms, fever, chills, vomiting, increased swelling or pus at your ankle or for any other concerns.  Be sure to finish your course of antibiotics as prescribed previously.

## 2015-07-12 NOTE — ED Notes (Signed)
Pt reports just dx with diabetes a few days ago, started metformin yesterday.  Today having blurred vision and thirty and sugar read 500 at home.

## 2015-07-16 LAB — WOUND CULTURE
CULTURE: NO GROWTH
GRAM STAIN: NONE SEEN
Special Requests: NORMAL

## 2016-05-11 ENCOUNTER — Emergency Department
Admission: EM | Admit: 2016-05-11 | Discharge: 2016-05-11 | Disposition: A | Discharge status: Home / Self Care | Payer: BLUE CROSS/BLUE SHIELD | Attending: Emergency Medicine | Admitting: Emergency Medicine

## 2016-05-11 DIAGNOSIS — E119 Type 2 diabetes mellitus without complications: Secondary | ICD-10-CM | POA: Diagnosis not present

## 2016-05-11 DIAGNOSIS — Z87891 Personal history of nicotine dependence: Secondary | ICD-10-CM | POA: Diagnosis not present

## 2016-05-11 DIAGNOSIS — I10 Essential (primary) hypertension: Secondary | ICD-10-CM | POA: Insufficient documentation

## 2016-05-11 DIAGNOSIS — M25572 Pain in left ankle and joints of left foot: Secondary | ICD-10-CM | POA: Diagnosis present

## 2016-05-11 LAB — SEDIMENTATION RATE: SED RATE: 5 mm/h (ref 0–15)

## 2016-05-11 LAB — CBC WITH DIFFERENTIAL/PLATELET
Basophils Absolute: 0.1 10*3/uL (ref 0–0.1)
Basophils Relative: 1 %
EOS ABS: 0.1 10*3/uL (ref 0–0.7)
EOS PCT: 1 %
HCT: 42.3 % (ref 40.0–52.0)
Hemoglobin: 14.3 g/dL (ref 13.0–18.0)
LYMPHS ABS: 3 10*3/uL (ref 1.0–3.6)
LYMPHS PCT: 36 %
MCH: 30.8 pg (ref 26.0–34.0)
MCHC: 33.7 g/dL (ref 32.0–36.0)
MCV: 91.2 fL (ref 80.0–100.0)
MONO ABS: 0.7 10*3/uL (ref 0.2–1.0)
MONOS PCT: 9 %
Neutro Abs: 4.4 10*3/uL (ref 1.4–6.5)
Neutrophils Relative %: 53 %
PLATELETS: 186 10*3/uL (ref 150–440)
RBC: 4.64 MIL/uL (ref 4.40–5.90)
RDW: 13.8 % (ref 11.5–14.5)
WBC: 8.3 10*3/uL (ref 3.8–10.6)

## 2016-05-11 LAB — BASIC METABOLIC PANEL
Anion gap: 7 (ref 5–15)
BUN: 16 mg/dL (ref 6–20)
CO2: 25 mmol/L (ref 22–32)
CREATININE: 1.04 mg/dL (ref 0.61–1.24)
Calcium: 8.9 mg/dL (ref 8.9–10.3)
Chloride: 107 mmol/L (ref 101–111)
GFR calc Af Amer: >60 mL/min (ref >60)
GLUCOSE: 132 mg/dL — AB (ref 65–99)
POTASSIUM: 3.6 mmol/L (ref 3.5–5.1)
SODIUM: 139 mmol/L (ref 135–145)

## 2016-05-11 NOTE — ED Notes (Signed)
Patient reports had surgery on 04/02/16 to removed "metal" from previous surgery related to fracture of ankle (left ankle).  Patient reports continue pain and that feels as though area is not healing well.  Noted open area to left lateral ankle.

## 2016-05-11 NOTE — Discharge Instructions (Signed)
Please seek medical attention for any high fevers, chest pain, shortness of breath, change in behavior, persistent vomiting, bloody stool or any other new or concerning symptoms.  Ankle Pain Ankle pain is a common symptom. The bones, cartilage, tendons, and muscles of the ankle joint perform a lot of work each day. The ankle joint holds your body weight and allows you to move around. Ankle pain can occur on either side or back of 1 or both ankles. Ankle pain may be sharp and burning or dull and aching. There may be tenderness, stiffness, redness, or warmth around the ankle. The pain occurs more often when a person walks or puts pressure on the ankle. CAUSES  There are many reasons ankle pain can develop. It is important to work with your caregiver to identify the cause since many conditions can impact the bones, cartilage, muscles, and tendons. Causes for ankle pain include:  Injury, including a break (fracture), sprain, or strain often due to a fall, sports, or a high-impact activity.  Swelling (inflammation) of a tendon (tendonitis).  Achilles tendon rupture.  Ankle instability after repeated sprains and strains.  Poor foot alignment.  Pressure on a nerve (tarsal tunnel syndrome).  Arthritis in the ankle or the lining of the ankle.  Crystal formation in the ankle (gout or pseudogout). DIAGNOSIS  A diagnosis is based on your medical history, your symptoms, results of your physical exam, and results of diagnostic tests. Diagnostic tests may include X-ray exams or a computerized magnetic scan (magnetic resonance imaging, MRI). TREATMENT  Treatment will depend on the cause of your ankle pain and may include:  Keeping pressure off the ankle and limiting activities.  Using crutches or other walking support (a cane or brace).  Using rest, ice, compression, and elevation.  Participating in physical therapy or home exercises.  Wearing shoe inserts or special shoes.  Losing  weight.  Taking medications to reduce pain or swelling or receiving an injection.  Undergoing surgery. HOME CARE INSTRUCTIONS   Only take over-the-counter or prescription medicines for pain, discomfort, or fever as directed by your caregiver.  Put ice on the injured area.  Put ice in a plastic bag.  Place a towel between your skin and the bag.  Leave the ice on for 15-20 minutes at a time, 03-04 times a day.  Keep your leg raised (elevated) when possible to lessen swelling.  Avoid activities that cause ankle pain.  Follow specific exercises as directed by your caregiver.  Record how often you have ankle pain, the location of the pain, and what it feels like. This information may be helpful to you and your caregiver.  Ask your caregiver about returning to work or sports and whether you should drive.  Follow up with your caregiver for further examination, therapy, or testing as directed. SEEK MEDICAL CARE IF:   Pain or swelling continues or worsens beyond 1 week.  You have an oral temperature above 102 F (38.9 C).  You are feeling unwell or have chills.  You are having an increasingly difficult time with walking.  You have loss of sensation or other new symptoms.  You have questions or concerns. MAKE SURE YOU:   Understand these instructions.  Will watch your condition.  Will get help right away if you are not doing well or get worse.   This information is not intended to replace advice given to you by your health care provider. Make sure you discuss any questions you have with your health care  provider.   Document Released: 05/06/2010 Document Revised: 02/08/2012 Document Reviewed: 06/18/2015 Elsevier Interactive Patient Education [employer]2016 Elsevier Inc.

## 2016-05-11 NOTE — ED Provider Notes (Signed)
[facility] Emergency Department Provider Note   ____________________________________________  Time seen: ~0435  I have reviewed the triage vital signs and the nursing notes.   HISTORY  Chief Complaint Wound Infection   History limited by: Not Limited   HPI Lawrence Richards. is a 35 y.o. male who presents to the emergency department today with continued left ankle pain. The patient had a surgery a little over  Month ago to remove plates from that ankle by [facility] orthopedics. Since then his left ankle has not completely healed. When he had his sutures removed it was worse according to the patient. He says that he was told to leave it open and to let the wound drain. He denies any fevers, nausea or vomiting.    Past Medical History  Diagnosis Date  . Hypertension   . Diabetes mellitus without complication     There are no active problems to display for this patient.   Past Surgical History  Procedure Laterality Date  . Ankle surgery      Bilateral    Current Outpatient Rx  Name  Route  Sig  Dispense  Refill  . clindamycin (CLEOCIN) 300 MG capsule   Oral   Take 1 capsule (300 mg total) by mouth 3 (three) times daily.   30 capsule   0   . oxyCODONE-acetaminophen (ROXICET) 5-325 MG per tablet   Oral   Take 1 tablet by mouth every 6 (six) hours as needed.   12 tablet   0     Allergies Review of patient's allergies indicates no known allergies.  No family history on file.  Social History Social History  Substance Use Topics  . Smoking status: Former Research scientist (life sciences)  . Smokeless tobacco: Not on file  . Alcohol Use: No    Review of Systems  Constitutional: Negative for fever. Cardiovascular: Negative for chest pain. Respiratory: Negative for shortness of breath. Gastrointestinal: Negative for abdominal pain, vomiting and diarrhea. Genitourinary: Negative for dysuria. Musculoskeletal: left ankle pain Skin: wound to left ankle.   Neurological: Negative for headaches, focal weakness or numbness.  10-point ROS otherwise negative.  ____________________________________________   PHYSICAL EXAM:  VITAL SIGNS: ED Triage Vitals  Enc Vitals Group     BP 05/11/16 0250 158/85 mmHg     Pulse Rate 05/11/16 0250 83     Resp 05/11/16 0250 20     Temp 05/11/16 0250 98.9 F (37.2 C)     Temp Source 05/11/16 0328 Oral     SpO2 05/11/16 0250 99 %     Weight 05/11/16 0250 245 lb (111.131 kg)     Height 05/11/16 0250 '5\' 11"'$  (1.803 m)     Head Cir --      Peak Flow --      Pain Score 05/11/16 0252 10   Constitutional: Alert and oriented. Well appearing and in no distress. Eyes: Conjunctivae are normal. PERRL. Normal extraocular movements. ENT   Head: Normocephalic and atraumatic.   Nose: No congestion/rhinnorhea.   Mouth/Throat: Mucous membranes are moist.   Neck: No stridor. Hematological/Lymphatic/Immunilogical: No cervical lymphadenopathy. Cardiovascular: Normal rate, regular rhythm.  No murmurs, rubs, or gallops. Respiratory: Normal respiratory effort without tachypnea nor retractions. Breath sounds are clear and equal bilaterally. No wheezes/rales/rhonchi. Gastrointestinal: Soft and nontender. No distention.  Genitourinary: Deferred Musculoskeletal: Left lateral ankle with open surgical incision wound. No drainage. No erythema or warmth. It appears to be healing appropriately.  Neurologic:  Normal speech and language. No gross focal  neurologic deficits are appreciated.  Skin:  Wound to lateral left ankle.  Psychiatric: Mood and affect are normal. Speech and behavior are normal. Patient exhibits appropriate insight and judgment.  ____________________________________________    LABS (pertinent positives/negatives)  Labs Reviewed  BASIC METABOLIC PANEL - Abnormal; Notable for the following:    Glucose, Bld 132 (*)    All other components within normal limits  CBC WITH DIFFERENTIAL/PLATELET   SEDIMENTATION RATE     ____________________________________________   EKG  None  ____________________________________________    RADIOLOGY  None  ____________________________________________   PROCEDURES  Procedure(s) performed: None  Critical Care performed: No  ____________________________________________   INITIAL IMPRESSION / ASSESSMENT AND PLAN / ED COURSE  Pertinent labs & imaging results that were available during my care of the patient were reviewed by me and considered in my medical decision making (see chart for details).  Patient presented to the emergency department today because of concerns for continued left ankle pain and wound. Patient is followed by [facility] orthopedics recently had a surgery to remove plates from that ankle. The pain has been persistent since then. The patient states that the wound actually has gotten better since when he was seen for suture removal. Blood work without any leukocytosis or elevation of ESR. Clinically does not appear infected. I did talk to the patient encourage patient to follow-up with [facility] orthopedics. Would defer antibiotics at this time.  ____________________________________________   FINAL CLINICAL IMPRESSION(S) / ED DIAGNOSES  Final diagnoses:  Ankle pain, left     Note: This dictation was prepared with Dragon dictation. Any transcriptional errors that result from this process are unintentional    Nance Pear, MD 05/11/16 [phone]

## 2016-06-30 ENCOUNTER — Emergency Department
Admission: EM | Admit: 2016-06-30 | Discharge: 2016-06-30 | Disposition: A | Discharge status: Home / Self Care | Payer: BLUE CROSS/BLUE SHIELD | Attending: Emergency Medicine | Admitting: Emergency Medicine

## 2016-06-30 ENCOUNTER — Emergency Department: Payer: BLUE CROSS/BLUE SHIELD

## 2016-06-30 DIAGNOSIS — M25572 Pain in left ankle and joints of left foot: Secondary | ICD-10-CM

## 2016-06-30 DIAGNOSIS — E119 Type 2 diabetes mellitus without complications: Secondary | ICD-10-CM | POA: Insufficient documentation

## 2016-06-30 DIAGNOSIS — M19071 Primary osteoarthritis, right ankle and foot: Secondary | ICD-10-CM

## 2016-06-30 DIAGNOSIS — M19072 Primary osteoarthritis, left ankle and foot: Secondary | ICD-10-CM | POA: Insufficient documentation

## 2016-06-30 DIAGNOSIS — G8929 Other chronic pain: Secondary | ICD-10-CM

## 2016-06-30 DIAGNOSIS — Z87891 Personal history of nicotine dependence: Secondary | ICD-10-CM | POA: Insufficient documentation

## 2016-06-30 DIAGNOSIS — I10 Essential (primary) hypertension: Secondary | ICD-10-CM | POA: Insufficient documentation

## 2016-06-30 MED ORDER — NABUMETONE 750 MG PO TABS: 750.0000 mg | ORAL_TABLET | Freq: Two times a day (BID) | ORAL | 0 refills | Status: AC

## 2016-06-30 MED ORDER — LIDOCAINE 5 % EX PTCH: 1.0000 | MEDICATED_PATCH | Freq: Two times a day (BID) | CUTANEOUS | 0 refills | Status: AC

## 2016-06-30 NOTE — ED Provider Notes (Signed)
[facility] Emergency Department Provider Note ____________________________________________  Time seen: 2114  I have reviewed the triage vital signs and the nursing notes.  HISTORY  Chief Complaint  Ankle Pain  HPI Lawrence Richards. is a 35 y.o. male presents to the ED for evaluation of his chronic left ankle pain. The patient presents today noting subtle swelling and a cystlike formation to the lateral left ankle within his surgical scar. He denies any recent injury, trauma, or accident. He was evaluated hereabout a month ago for evaluation of potential wound infection after he had his ankle fixator hardware removed after nearly 9 years. Patient with a history of diabetes and hypertension presents today for exquisite pain to the cyst like formation of his lateral ankle scar. He denies any fevers, chills, sweats in the interim.  Past Medical History:  Diagnosis Date  . Diabetes mellitus without complication   . Hypertension     There are no active problems to display for this patient.   Past Surgical History:  Procedure Laterality Date  . ANKLE SURGERY     Bilateral    Current Outpatient Rx  . Order #: [id number] Class: Print  . Order #: [id number] Class: Print  . Order #: [id number] Class: Print  . Order #: [id number] Class: Print    Allergies Review of patient's allergies indicates no known allergies.  No family history on file.  Social History Social History  Substance Use Topics  . Smoking status: Former Games developer  . Smokeless tobacco: Not on file  . Alcohol use No   Review of Systems  Constitutional: Negative for fever. Musculoskeletal: Negative for back pain. Left ankle pain and local swelling as above.  Skin: Negative for rash. Neurological: Negative for headaches, focal weakness or numbness. ____________________________________________  PHYSICAL EXAM:  VITAL SIGNS: ED Triage Vitals  Enc Vitals Group     BP 06/30/16 2016 (!) 139/96     Pulse Rate 06/30/16 2016 (!) 108     Resp 06/30/16 2016 18     Temp 06/30/16 2016 98.3 F (36.8 C)     Temp Source 06/30/16 2016 Oral     SpO2 06/30/16 2016 98 %     Weight 06/30/16 2013 240 lb (108.9 kg)     Height 06/30/16 2013 5\' 11"  (1.803 m)     Head Circumference --      Peak Flow --      Pain Score 06/30/16 2013 10     Pain Loc --      Pain Edu? --      Excl. in GC? --    Constitutional: Alert and oriented. Well appearing and in no distress. Head: Normocephalic and atraumatic. Cardiovascular: Normal rate distal pulses and cap refill Respiratory: Normal respiratory effort.  Musculoskeletal: Nontender with normal range of motion in all extremities. Right ankle with chronic surgical scar in the lateral and medial aspect without acute signs of infection, erythema, or dehiscence. The midportion of the lateral ankle surgical scar notes about a 1-1/2 cm soft, firm, nodular projection. There is no erythema, redness, or induration noted. There is no discharge, drainage, or fluctuance noted. Neurologic:  Normal gait without ataxia. Normal speech and language. No gross focal neurologic deficits are appreciated. Skin:  Skin is warm, dry and intact. No rash noted. ____________________________________________   RADIOLOGY  Right ankle  IMPRESSION: Chronic changes of the distal tibia/ fibula and ankle mortise as Described. Mild irregularity, residual hardware debris and hardware tracts along the surface of the mid to distal  fibula. No acute fracture and no bone destruction to suggest Osteomyelitis.  I, Shaterria Sager, Charlesetta Ivory, personally viewed and evaluated these images (plain radiographs) as part of my medical decision making, as well as reviewing the written report by the radiologist. ____________________________________________  PROCEDURES  Hydrocolloid pad applied for skin protection ____________________________________________  INITIAL IMPRESSION / ASSESSMENT AND PLAN / ED  COURSE  Patient with a chronic left ankle pain following remote ORIF and recent hardware removal. He has pain secondary to fusion of his ankle joint. He also has some residual metallic hardware debris in the subcutaneous tissues as noted on x-ray, which may represent some subcutaneous cyst or seroma. No evidence of acute cellulitic changes on exam. Patient is discharged with a prescription for Lidoderm patches and Relafen to dose as directed. He is encouraged to up dose his Gabapentin to 300 mg TID. He will follow-up with [facility] for further management, including custom orthotics or rocker-bottom shoe caps for decreased ankle mobility.   Clinical Course   ____________________________________________  FINAL CLINICAL IMPRESSION(S) / ED DIAGNOSES  Final diagnoses:  Chronic ankle pain, left  DJD (degenerative joint disease), ankle and foot, right     Lissa Hoard, PA-C 06/30/16 2357    Sharyn Creamer, MD 07/01/16 0028

## 2016-06-30 NOTE — ED Triage Notes (Signed)
Pt had hardware place to right ankle in 2009 after fracture, states had hardware removed may 4th due to abscess. States has not had problems since but now has increased swelling to pain to outer aspect of right ankle. Pt denies any obvious injury or cause.

## 2016-06-30 NOTE — ED Notes (Addendum)
Pt had hardware place to right ankle in 2009 after fracture noted scar from surgery,  Pt states had hardware removed may 4th due to abscess. States has not had problems since but yesterday started having increased swelling and pain to outer lateral side  of right ankle. Pt states pain has radiated up in to whole leg now. pain is 10/10. Severe pain to the touch of ankle. Pt has good pulses, pt able to move toes, and has good cap refill. Pt denies any new injury.

## 2016-06-30 NOTE — Discharge Instructions (Signed)
Take the prescription meds as directed. Consider compression socks and custom shoe fittings as discussed. Follow-up with [facility] for further management.

## 2016-10-12 ENCOUNTER — Ambulatory Visit: Payer: Self-pay | Admitting: Surgery

## 2016-11-15 IMAGING — CR DG ANKLE COMPLETE 3+V*R*
1 series · 3 of 3 positions shown · non-contrast
Comparison: 06/03/2015

CLINICAL DATA: Right ankle swelling.

EXAM:
RIGHT ANKLE - COMPLETE 3+ VIEW

[Series 1: ap · 0.17mm/px · 3 of 3 slices shown]
[im 1/3]
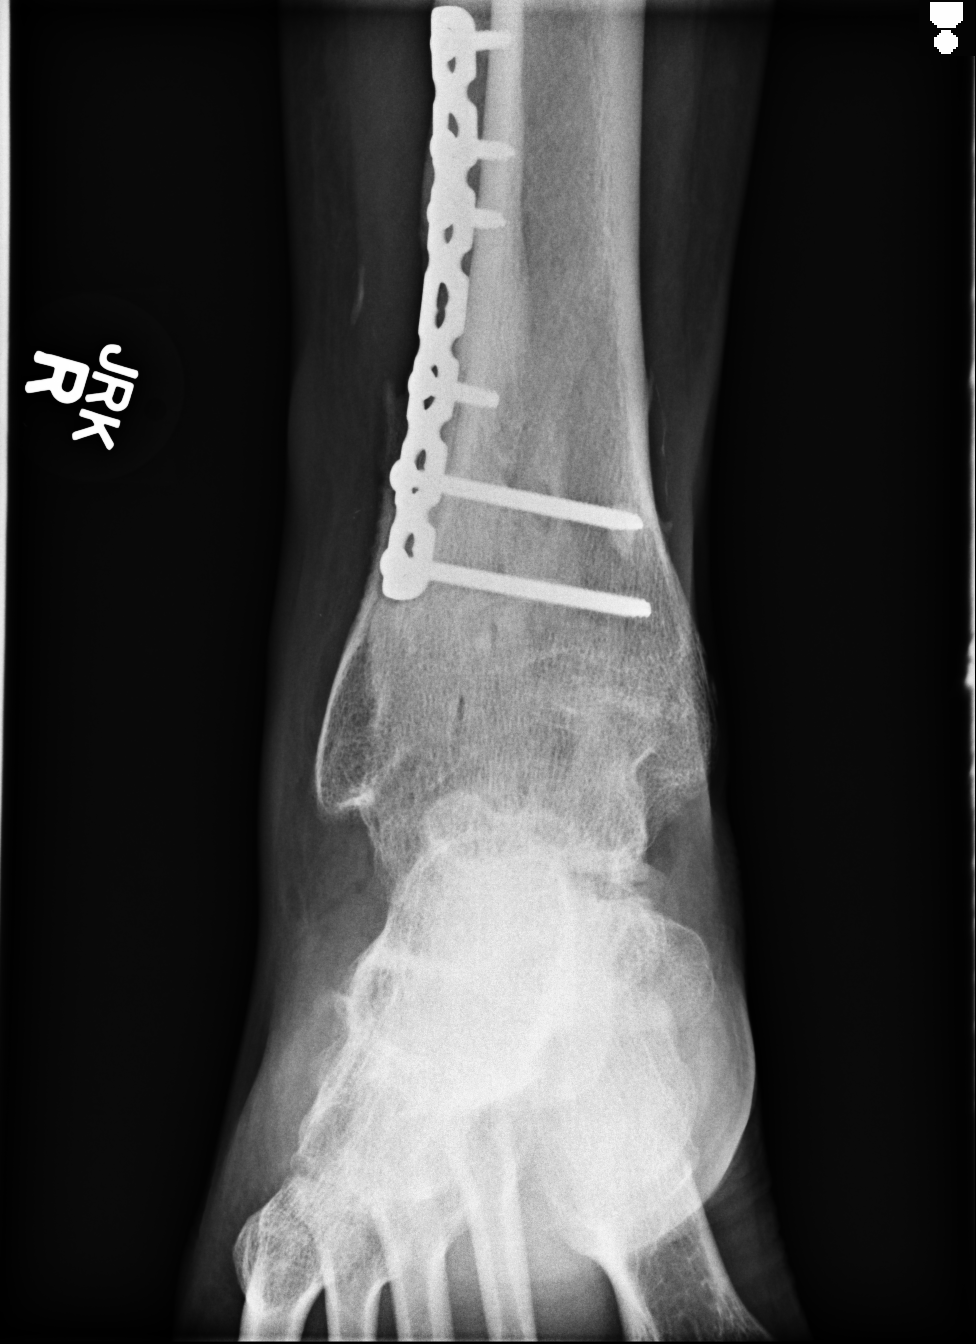
[im 2/3]
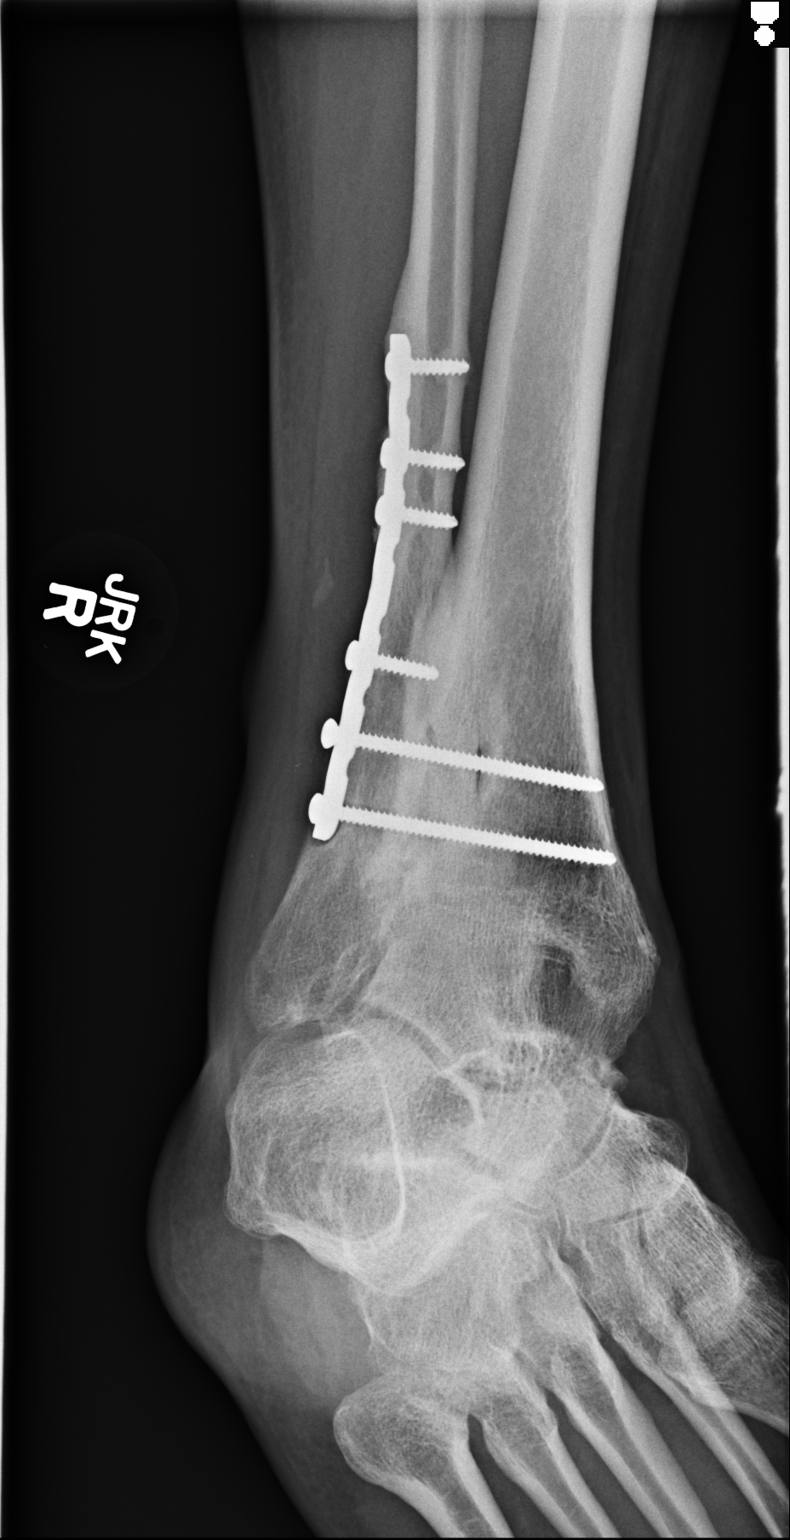
[im 3/3]
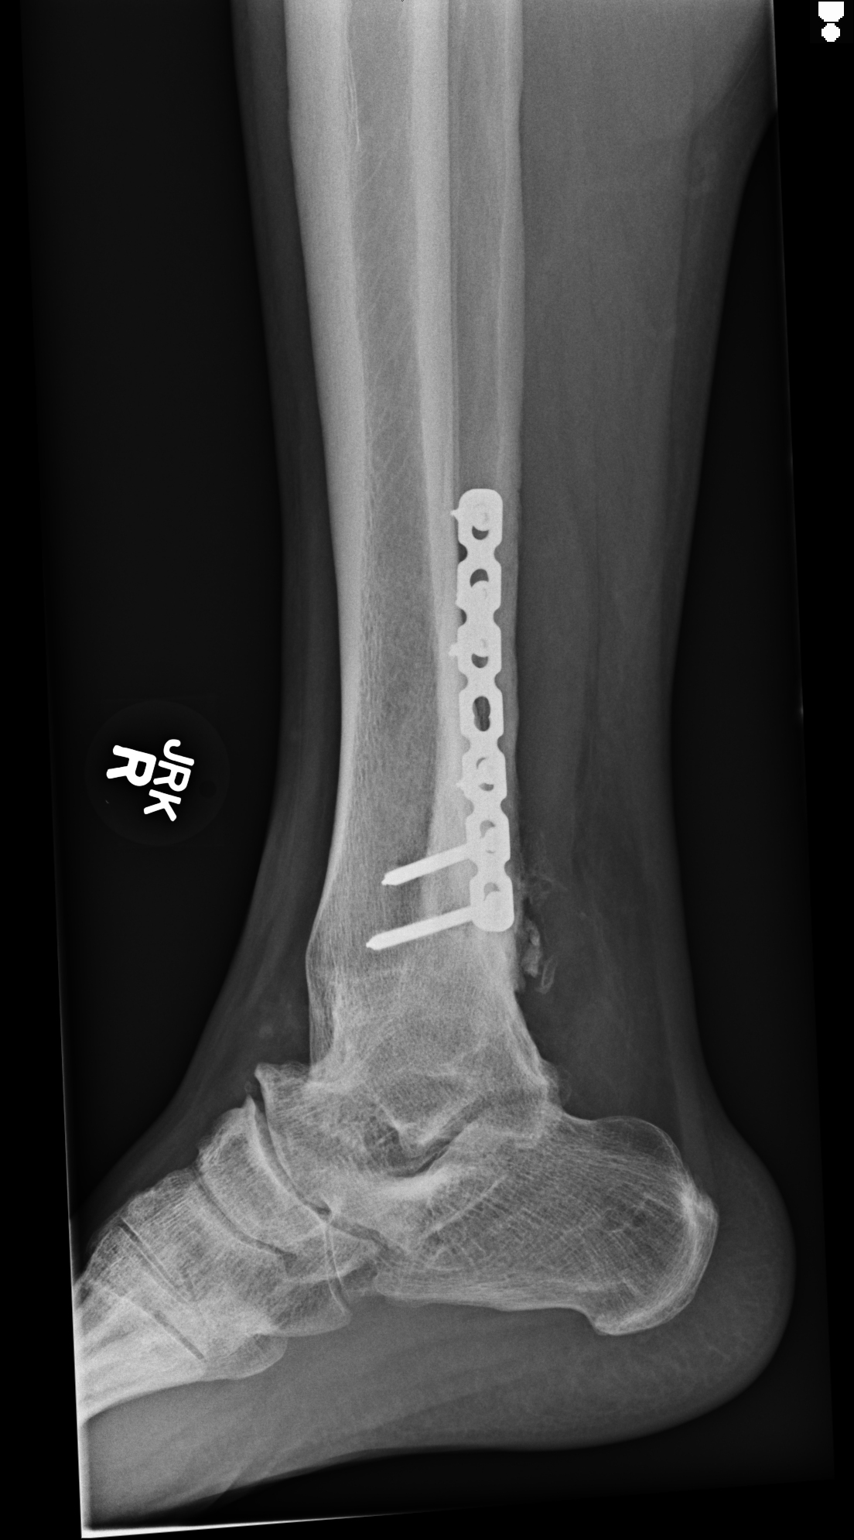

[3 of 3 positions shown; findings below may reference images not displayed]

FINDINGS: There is prior right ankle arthrodesis with plate screw fixation
hardware at the lateral aspect of the distal fibula an with to
distal interosseous screws. The arthrodesis is solidly ossified. The
fixation hardware appears intact. No superimposed fracture is
evident. There is no bone lesion or bony destruction. Mild sclerosis
and moderate osteophyte formation is present about the talonavicular
articulation.
IMPRESSION: Solidly ossified right ankle arthrodesis. No evidence of hardware
failure. No fracture or dislocation.

## 2017-03-21 ENCOUNTER — Emergency Department: Payer: Self-pay

## 2017-03-21 DIAGNOSIS — Y9389 Activity, other specified: Secondary | ICD-10-CM | POA: Insufficient documentation

## 2017-03-21 DIAGNOSIS — Z87891 Personal history of nicotine dependence: Secondary | ICD-10-CM | POA: Insufficient documentation

## 2017-03-21 DIAGNOSIS — Z79899 Other long term (current) drug therapy: Secondary | ICD-10-CM | POA: Insufficient documentation

## 2017-03-21 DIAGNOSIS — W228XXA Striking against or struck by other objects, initial encounter: Secondary | ICD-10-CM | POA: Insufficient documentation

## 2017-03-21 DIAGNOSIS — S60212A Contusion of left wrist, initial encounter: Secondary | ICD-10-CM | POA: Insufficient documentation

## 2017-03-21 DIAGNOSIS — I10 Essential (primary) hypertension: Secondary | ICD-10-CM | POA: Insufficient documentation

## 2017-03-21 DIAGNOSIS — E119 Type 2 diabetes mellitus without complications: Secondary | ICD-10-CM | POA: Insufficient documentation

## 2017-03-21 DIAGNOSIS — Y990 Civilian activity done for income or pay: Secondary | ICD-10-CM | POA: Insufficient documentation

## 2017-03-21 DIAGNOSIS — Y929 Unspecified place or not applicable: Secondary | ICD-10-CM | POA: Insufficient documentation

## 2017-03-21 NOTE — ED Triage Notes (Signed)
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

## 2017-03-22 ENCOUNTER — Emergency Department
Admission: EM | Admit: 2017-03-22 | Discharge: 2017-03-22 | Disposition: A | Discharge status: Home / Self Care | Payer: Self-pay | Attending: Emergency Medicine | Admitting: Emergency Medicine

## 2017-03-22 DIAGNOSIS — S60212A Contusion of left wrist, initial encounter: Secondary | ICD-10-CM

## 2017-03-22 DIAGNOSIS — M25532 Pain in left wrist: Secondary | ICD-10-CM

## 2017-03-22 DIAGNOSIS — R52 Pain, unspecified: Secondary | ICD-10-CM

## 2017-03-22 LAB — GLUCOSE, CAPILLARY: Glucose-Capillary: 158 mg/dL — ABNORMAL HIGH (ref 65–99)

## 2017-03-22 MED ORDER — NAPROXEN 500 MG PO TABS
500.0000 mg | ORAL_TABLET | Freq: Once | ORAL | Status: AC
  Administered 2017-03-22: 500 mg via ORAL
  Filled 2017-03-22: qty 1

## 2017-03-22 MED ORDER — NAPROXEN 500 MG PO TABS: 500.0000 mg | ORAL_TABLET | Freq: Two times a day (BID) | ORAL | 0 refills | Status: AC

## 2017-03-22 NOTE — Discharge Instructions (Signed)
1. You may take pain medicine as needed. 2. Wear splint as needed for comfort. 3. Return to the ER for worsening symptoms, increased swelling, numbness/tingling or concerns.

## 2017-03-22 NOTE — ED Provider Notes (Signed)
[facility] Emergency Department Provider Note   ____________________________________________   None    (approximate)  I have reviewed the triage vital signs and the nursing notes.   HISTORY  Chief Complaint Wrist Pain    HPI Lawrence Richards. is a 36 y.o. male who presents to the ED from home with a chief complaint of left wrist pain. 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 He does not wish to file workmen's comp. He is right-hand dominant. Complains of pain and swelling to his left dorsal wrist, especially on movement. Denies other injuries or complaints.   Past Medical History:  Diagnosis Date  . Diabetes mellitus without complication   . Hypertension     There are no active problems to display for this patient.   Past Surgical History:  Procedure Laterality Date  . ANKLE SURGERY     Bilateral    Prior to Admission medications   Medication Sig Start Date End Date Taking? Authorizing Provider  clindamycin (CLEOCIN) 300 MG capsule Take 1 capsule (300 mg total) by mouth 3 (three) times daily. 07/01/15   Rebecka Apley, MD  nabumetone (RELAFEN) 750 MG tablet Take 1 tablet (750 mg total) by mouth 2 (two) times daily. 06/30/16   Jenise V Bacon Menshew, PA-C  oxyCODONE-acetaminophen (ROXICET) 5-325 MG per tablet Take 1 tablet by mouth every 6 (six) hours as needed. 07/01/15   Rebecka Apley, MD    Allergies Patient has no known allergies.  No family history on file.  Social History Social History  Substance Use Topics  . Smoking status: Former Games developer  . Smokeless tobacco: Not on file  . Alcohol use No    Review of Systems  Constitutional: No fever/chills. Eyes: No visual changes. ENT: No sore throat. Cardiovascular: Denies chest pain. Respiratory: Denies shortness of breath. Gastrointestinal: No abdominal pain.  No nausea, no vomiting.  No diarrhea.  No constipation. Genitourinary: Negative for  dysuria. Musculoskeletal: Positive for left wrist pain. Negative for back pain. Skin: Negative for rash. Neurological: Negative for headaches, focal weakness or numbness.  10-point ROS otherwise negative.  ____________________________________________   PHYSICAL EXAM:  VITAL SIGNS: ED Triage Vitals  Enc Vitals Group     BP 03/21/17 2354 (!) 161/91     Pulse Rate 03/21/17 2354 94     Resp 03/21/17 2354 18     Temp 03/21/17 2354 99.8 F (37.7 C)     Temp Source 03/21/17 2354 Oral     SpO2 03/21/17 2354 98 %     Weight 03/21/17 2346 235 lb (106.6 kg)     Height 03/21/17 2346 6' (1.829 m)     Head Circumference --      Peak Flow --      Pain Score 03/21/17 2346 10     Pain Loc --      Pain Edu? --      Excl. in GC? --     Constitutional: Alert and oriented. Well appearing and in no acute distress. Eyes: Conjunctivae are normal. PERRL. EOMI. Head: Atraumatic. Nose: No congestion/rhinnorhea. Mouth/Throat: Mucous membranes are moist.  Oropharynx non-erythematous. Neck: No stridor.   Cardiovascular: Normal rate, regular rhythm. Grossly normal heart sounds.  Good peripheral circulation. Respiratory: Normal respiratory effort.  No retractions. Lungs CTAB. Gastrointestinal: Soft and nontender. No distention. No abdominal bruits. No CVA tenderness. Musculoskeletal:  Left wrist: Dorsal wrist mildly swollen and tender to palpation. Limited range of motion  secondary to pain. 2+ radial pulse. Brisk, less than 5 second capillary refill. Neurologic:  Normal speech and language. No gross focal neurologic deficits are appreciated. No gait instability. Skin:  Skin is warm, dry and intact. No rash noted. Psychiatric: Mood and affect are normal. Speech and behavior are normal.  ____________________________________________   LABS (all labs ordered are listed, but only abnormal results are displayed)  Labs Reviewed  GLUCOSE, CAPILLARY - Abnormal; Notable for the following:       Result  Value   Glucose-Capillary 158 (*)    All other components within normal limits   ____________________________________________  EKG  None ____________________________________________  RADIOLOGY  Left wrist complete (viewed by me, interpreted per Dr. Sterling Big): No fracture or malalignment of the left wrist. Mild soft tissue  swelling of the dorsum of the hand and wrist.   ____________________________________________   PROCEDURES  Procedure(s) performed: None  Procedures  Critical Care performed: No  ____________________________________________   INITIAL IMPRESSION / ASSESSMENT AND PLAN / ED COURSE  Pertinent labs & imaging results that were available during my care of the patient were reviewed by me and considered in my medical decision making (see chart for details).  36 year old male who presents with left wrist contusion after striking it on a forklift yesterday. X-ray imaging studies are negative for acute fracture/dislocation. Will place in focal wrist splint, start NSAIDs and follow-up with orthopedics. Strict return precautions given. Patient verbalizes understanding and agrees with plan of care.      ____________________________________________   FINAL CLINICAL IMPRESSION(S) / ED DIAGNOSES  Final diagnoses:  Left wrist pain  Contusion of left wrist, initial encounter      NEW MEDICATIONS STARTED DURING THIS VISIT:  New Prescriptions   No medications on file     Note:  This document was prepared using Dragon voice recognition software and may include unintentional dictation errors.    Irean Hong, MD 03/22/17 [phone]

## 2017-08-13 ENCOUNTER — Emergency Department
Admission: EM | Admit: 2017-08-13 | Discharge: 2017-08-13 | Disposition: A | Discharge status: Home / Self Care | Payer: Self-pay | Attending: Emergency Medicine | Admitting: Emergency Medicine

## 2017-08-13 DIAGNOSIS — R739 Hyperglycemia, unspecified: Secondary | ICD-10-CM

## 2017-08-13 DIAGNOSIS — R5383 Other fatigue: Secondary | ICD-10-CM | POA: Insufficient documentation

## 2017-08-13 DIAGNOSIS — E1165 Type 2 diabetes mellitus with hyperglycemia: Secondary | ICD-10-CM | POA: Insufficient documentation

## 2017-08-13 DIAGNOSIS — I10 Essential (primary) hypertension: Secondary | ICD-10-CM | POA: Insufficient documentation

## 2017-08-13 DIAGNOSIS — Z87891 Personal history of nicotine dependence: Secondary | ICD-10-CM | POA: Insufficient documentation

## 2017-08-13 LAB — BASIC METABOLIC PANEL
ANION GAP: 8 (ref 5–15)
BUN: 14 mg/dL (ref 6–20)
CO2: 24 mmol/L (ref 22–32)
Calcium: 8.7 mg/dL — ABNORMAL LOW (ref 8.9–10.3)
Chloride: 105 mmol/L (ref 101–111)
Creatinine, Ser: 1.04 mg/dL (ref 0.61–1.24)
GFR calc Af Amer: >60 mL/min (ref >60)
GLUCOSE: 229 mg/dL — AB (ref 65–99)
POTASSIUM: 4 mmol/L (ref 3.5–5.1)
Sodium: 137 mmol/L (ref 135–145)

## 2017-08-13 LAB — BLOOD GAS, VENOUS
Acid-base deficit: 3.3 mmol/L — ABNORMAL HIGH (ref 0.0–2.0)
Bicarbonate: 22.1 mmol/L (ref 20.0–28.0)
O2 SAT: 88.2 %
PCO2 VEN: 40 mmHg — AB (ref 44.0–60.0)
PO2 VEN: 58 mmHg — AB (ref 32.0–45.0)
Patient temperature: 37
pH, Ven: 7.35 (ref 7.250–7.430)

## 2017-08-13 LAB — CBC
HEMATOCRIT: 42.3 % (ref 40.0–52.0)
HEMOGLOBIN: 14.7 g/dL (ref 13.0–18.0)
MCH: 30.7 pg (ref 26.0–34.0)
MCHC: 34.8 g/dL (ref 32.0–36.0)
MCV: 88.2 fL (ref 80.0–100.0)
Platelets: 197 10*3/uL (ref 150–440)
RBC: 4.8 MIL/uL (ref 4.40–5.90)
RDW: 13.6 % (ref 11.5–14.5)
WBC: 8.6 10*3/uL (ref 3.8–10.6)

## 2017-08-13 LAB — GLUCOSE, CAPILLARY
GLUCOSE-CAPILLARY: 215 mg/dL — AB (ref 65–99)
Glucose-Capillary: 259 mg/dL — ABNORMAL HIGH (ref 65–99)

## 2017-08-13 MED ORDER — SODIUM CHLORIDE 0.9 % IV BOLUS (SEPSIS)
1000.0000 mL | Freq: Once | INTRAVENOUS | Status: AC
  Administered 2017-08-13: 1000 mL via INTRAVENOUS

## 2017-08-13 NOTE — Discharge Instructions (Signed)
Please take all of your medications as prescribed and follow-up with your primary care physician as needed. Return to the emergency department for any concerns.  It was a pleasure to take care of you today, and thank you for coming to our emergency department.  If you have any questions or concerns before leaving please ask the nurse to grab me and I'm more than happy to go through your aftercare instructions again.  If you were prescribed any opioid pain medication today such as Norco, Vicodin, Percocet, morphine, hydrocodone, or oxycodone please make sure you do not drive when you are taking this medication as it can alter your ability to drive safely.  If you have any concerns once you are home that you are not improving or are in fact getting worse before you can make it to your follow-up appointment, please do not hesitate to call [phone] and come back for further evaluation.  Merrily Brittle, MD  Results for orders placed or performed during the hospital encounter of 08/13/17  CBC  Result Value Ref Range   WBC 8.6 3.8 - 10.6 K/uL   RBC 4.80 4.40 - 5.90 MIL/uL   Hemoglobin 14.7 13.0 - 18.0 g/dL   HCT 16.1 09.6 - 04.5 %   MCV 88.2 80.0 - 100.0 fL   MCH 30.7 26.0 - 34.0 pg   MCHC 34.8 32.0 - 36.0 g/dL   RDW 40.9 81.1 - 91.4 %   Platelets 197 150 - 440 K/uL  Blood gas, venous  Result Value Ref Range   pH, Ven 7.35 7.250 - 7.430   pCO2, Ven 40 (L) 44.0 - 60.0 mmHg   pO2, Ven 58.0 (H) 32.0 - 45.0 mmHg   Bicarbonate 22.1 20.0 - 28.0 mmol/L   Acid-base deficit 3.3 (H) 0.0 - 2.0 mmol/L   O2 Saturation 88.2 %   Patient temperature 37.0    Collection site VENOUS    Sample type VENOUS   Glucose, capillary  Result Value Ref Range   Glucose-Capillary 259 (H) 65 - 99 mg/dL  Basic metabolic panel  Result Value Ref Range   Sodium 137 135 - 145 mmol/L   Potassium 4.0 3.5 - 5.1 mmol/L   Chloride 105 101 - 111 mmol/L   CO2 24 22 - 32 mmol/L   Glucose, Bld 229 (H) 65 - 99 mg/dL   BUN 14 6 - 20  mg/dL   Creatinine, Ser 7.82 0.61 - 1.24 mg/dL   Calcium 8.7 (L) 8.9 - 10.3 mg/dL   GFR calc non Af Amer >60 >60 mL/min   GFR calc Af Amer >60 >60 mL/min   Anion gap 8 5 - 15  Glucose, capillary  Result Value Ref Range   Glucose-Capillary 215 (H) 65 - 99 mg/dL

## 2017-08-13 NOTE — ED Triage Notes (Signed)
Pt presents via POV c/o feeling "sluggish"for over a week. Reports had physical yesterday with noted hyperglycemia. Pt reports hx DM.

## 2017-08-13 NOTE — ED Provider Notes (Signed)
[facility] Emergency Department Provider Note  ____________________________________________   First MD Initiated Contact with Patient 08/13/17 2007     (approximate)  I have reviewed the triage vital signs and the nursing notes.   HISTORY  Chief Complaint Hyperglycemia    HPI Lawrence Richards. is a 36 y.o. male who self presents to the emergency department with several days of generalized fatigue. He said that yesterday he went to get a physical exam for a job and they checked his blood sugar was 250 and his blood pressure was 140/90 and they said his numbers were too high and they were unable to perform the physical. The patient does have a history of hypertension and diabetes for which she takes   Past Medical History:  Diagnosis Date  . Diabetes mellitus without complication (HCC)   . Hypertension     There are no active problems to display for this patient.   Past Surgical History:  Procedure Laterality Date  . ANKLE SURGERY     Bilateral    Prior to Admission medications   Medication Sig Start Date End Date Taking? Authorizing Provider  clindamycin (CLEOCIN) 300 MG capsule Take 1 capsule (300 mg total) by mouth 3 (three) times daily. 07/01/15   Rebecka Apley, MD  nabumetone (RELAFEN) 750 MG tablet Take 1 tablet (750 mg total) by mouth 2 (two) times daily. 06/30/16   Menshew, Charlesetta Ivory, PA-C  naproxen (NAPROSYN) 500 MG tablet Take 1 tablet (500 mg total) by mouth 2 (two) times daily with a meal. 03/22/17   Irean Hong, MD  oxyCODONE-acetaminophen (ROXICET) 5-325 MG per tablet Take 1 tablet by mouth every 6 (six) hours as needed. 07/01/15   Rebecka Apley, MD    Allergies Patient has no known allergies.  History reviewed. No pertinent family history.  Social History Social History  Substance Use Topics  . Smoking status: Former Games developer  . Smokeless tobacco: Never Used  . Alcohol use No    Review of  Systems Constitutional: No fever/chills Eyes: No visual changes. ENT: No sore throat. Cardiovascular: Denies chest pain. Respiratory: Denies shortness of breath. Gastrointestinal: No abdominal pain.  No nausea, no vomiting.  No diarrhea.  No constipation. Genitourinary: Negative for dysuria. Musculoskeletal: Negative for back pain. Skin: Negative for rash. Neurological: Negative for headaches, focal weakness or numbness.   ____________________________________________   PHYSICAL EXAM:  VITAL SIGNS: ED Triage Vitals  Enc Vitals Group     BP 08/13/17 1902 (!) 148/95     Pulse Rate 08/13/17 1902 88     Resp 08/13/17 1902 14     Temp 08/13/17 1902 98.6 F (37 C)     Temp Source 08/13/17 1902 Oral     SpO2 08/13/17 1902 98 %     Weight 08/13/17 1859 238 lb (108 kg)     Height 08/13/17 1859 6' (1.829 m)     Head Circumference --      Peak Flow --      Pain Score --      Pain Loc --      Pain Edu? --      Excl. in GC? --     Constitutional: Alert and oriented 4 well appearing nontoxic no diaphoresis speaks full clear sentences Eyes: PERRL EOMI. Head: Atraumatic. Nose: No congestion/rhinnorhea. Mouth/Throat: No trismus Neck: No stridor.   Cardiovascular: Normal rate, regular rhythm. Grossly normal heart sounds.  Good peripheral circulation. Respiratory: Normal respiratory effort.  No retractions. Lungs CTAB and moving good air Gastrointestinal: Soft nontender Musculoskeletal: No lower extremity edema   Neurologic:  Normal speech and language. No gross focal neurologic deficits are appreciated. Skin:  Skin is warm, dry and intact. No rash noted. Psychiatric: Mood and affect are normal. Speech and behavior are normal.    ____________________________________________   DIFFERENTIAL includes but not limited to  DKA, HHS, hyperglycemia, acidemia, hypertensive emergency ____________________________________________   LABS (all labs ordered are listed, but only abnormal  results are displayed)  Labs Reviewed  BLOOD GAS, VENOUS - Abnormal; Notable for the following:       Result Value   pCO2, Ven 40 (*)    pO2, Ven 58.0 (*)    Acid-base deficit 3.3 (*)    All other components within normal limits  GLUCOSE, CAPILLARY - Abnormal; Notable for the following:    Glucose-Capillary 259 (*)    All other components within normal limits  BASIC METABOLIC PANEL - Abnormal; Notable for the following:    Glucose, Bld 229 (*)    Calcium 8.7 (*)    All other components within normal limits  GLUCOSE, CAPILLARY - Abnormal; Notable for the following:    Glucose-Capillary 215 (*)    All other components within normal limits  CBC  CBG MONITORING, ED    Elevated glucose but normal anion gap slightly low pH __________________________________________  EKG   ____________________________________________  RADIOLOGY   ____________________________________________   PROCEDURES  Procedure(s) performed: no  Procedures  Critical Care performed: no  Observation: no ____________________________________________   INITIAL IMPRESSION / ASSESSMENT AND PLAN / ED COURSE  Pertinent labs & imaging results that were available during my care of the patient were reviewed by me and considered in my medical decision making (see chart for details).  The patient arrives with generalized malaise although is hemodynamically stable. Blood work was done which fortunately shows no significant metabolic derangement. The patient feels markedly improved after some fluids. He'll be discharged home with primary care follow-up as scheduled. I've encouraged him to begin taking his medications as actually prescribed. He is discharged home in improved condition.      ____________________________________________   FINAL CLINICAL IMPRESSION(S) / ED DIAGNOSES  Final diagnoses:  Hyperglycemia  Hypertension, unspecified type      NEW MEDICATIONS STARTED DURING THIS VISIT:  New  Prescriptions   No medications on file     Note:  This document was prepared using Dragon voice recognition software and may include unintentional dictation errors.     Merrily Brittle, MD 08/13/17 2310

## 2017-10-16 ENCOUNTER — Emergency Department
Admission: EM | Admit: 2017-10-16 | Discharge: 2017-10-16 | Disposition: A | Discharge status: Home / Self Care | Payer: Self-pay | Attending: Emergency Medicine | Admitting: Emergency Medicine

## 2017-10-16 ENCOUNTER — Encounter: Payer: Self-pay | Admitting: Emergency Medicine

## 2017-10-16 DIAGNOSIS — Z79899 Other long term (current) drug therapy: Secondary | ICD-10-CM | POA: Insufficient documentation

## 2017-10-16 DIAGNOSIS — T148XXA Other injury of unspecified body region, initial encounter: Secondary | ICD-10-CM

## 2017-10-16 DIAGNOSIS — F17200 Nicotine dependence, unspecified, uncomplicated: Secondary | ICD-10-CM | POA: Insufficient documentation

## 2017-10-16 DIAGNOSIS — X58XXXA Exposure to other specified factors, initial encounter: Secondary | ICD-10-CM | POA: Insufficient documentation

## 2017-10-16 DIAGNOSIS — I10 Essential (primary) hypertension: Secondary | ICD-10-CM | POA: Insufficient documentation

## 2017-10-16 DIAGNOSIS — Y939 Activity, unspecified: Secondary | ICD-10-CM | POA: Insufficient documentation

## 2017-10-16 DIAGNOSIS — S29012A Strain of muscle and tendon of back wall of thorax, initial encounter: Secondary | ICD-10-CM | POA: Insufficient documentation

## 2017-10-16 DIAGNOSIS — Y999 Unspecified external cause status: Secondary | ICD-10-CM | POA: Insufficient documentation

## 2017-10-16 DIAGNOSIS — Y929 Unspecified place or not applicable: Secondary | ICD-10-CM | POA: Insufficient documentation

## 2017-10-16 DIAGNOSIS — E119 Type 2 diabetes mellitus without complications: Secondary | ICD-10-CM | POA: Insufficient documentation

## 2017-10-16 MED ORDER — CYCLOBENZAPRINE HCL 10 MG PO TABS: 10.0000 mg | ORAL_TABLET | Freq: Three times a day (TID) | ORAL | 0 refills | Status: AC | PRN

## 2017-10-16 MED ORDER — IBUPROFEN 800 MG PO TABS: 800.0000 mg | ORAL_TABLET | Freq: Three times a day (TID) | ORAL | 0 refills | Status: AC | PRN

## 2017-10-16 NOTE — ED Provider Notes (Signed)
[facility] Emergency Department Provider Note   ____________________________________________   First MD Initiated Contact with Patient 10/16/17 2048     (approximate)  I have reviewed the triage vital signs and the nursing notes.   HISTORY  Chief Complaint Back Pain    HPI Lawrence BirksDavid N Encina Jr. is a 36 y.o. male patient complain left mid back pain after taking a shower this morning. Patient stated pain radiates from left scapular area to the lateral aspect of his upper left back. Patient stated pain increases with abduction overhead reaching of the left upper extremity. No palliative measures for complaint.Patient rates the pain as a 9/10. Patient described a pain as "shooting".   Past Medical History:  Diagnosis Date  . Diabetes mellitus without complication (HCC)   . Hypertension     There are no active problems to display for this patient.   Past Surgical History:  Procedure Laterality Date  . ANKLE SURGERY     Bilateral    Prior to Admission medications   Medication Sig Start Date End Date Taking? Authorizing Provider  clindamycin (CLEOCIN) 300 MG capsule Take 1 capsule (300 mg total) by mouth 3 (three) times daily. 07/01/15   Rebecka ApleyWebster, Allison P, MD  cyclobenzaprine (FLEXERIL) 10 MG tablet Take 1 tablet (10 mg total) 3 (three) times daily as needed by mouth. 10/16/17   Joni ReiningSmith, Jerick Khachatryan K, PA-C  ibuprofen (ADVIL,MOTRIN) 800 MG tablet Take 1 tablet (800 mg total) every 8 (eight) hours as needed by mouth for moderate pain. 10/16/17   Joni ReiningSmith, Kendal Raffo K, PA-C  nabumetone (RELAFEN) 750 MG tablet Take 1 tablet (750 mg total) by mouth 2 (two) times daily. 06/30/16   Menshew, Charlesetta IvoryJenise V Bacon, PA-C  naproxen (NAPROSYN) 500 MG tablet Take 1 tablet (500 mg total) by mouth 2 (two) times daily with a meal. 03/22/17   Irean HongSung, Jade J, MD  oxyCODONE-acetaminophen (ROXICET) 5-325 MG per tablet Take 1 tablet by mouth every 6 (six) hours as needed. 07/01/15   Rebecka ApleyWebster, Allison  P, MD    Allergies Patient has no known allergies.  History reviewed. No pertinent family history.  Social History Social History   Tobacco Use  . Smoking status: Current Every Day Smoker  . Smokeless tobacco: Never Used  Substance Use Topics  . Alcohol use: No  . Drug use: No    Review of Systems Constitutional: No fever/chills Eyes: No visual changes. ENT: No sore throat. Cardiovascular: Denies chest pain. Respiratory: Denies shortness of breath. Gastrointestinal: No abdominal pain.  No nausea, no vomiting.  No diarrhea.  No constipation. Genitourinary: Negative for dysuria. Musculoskeletal: Negative for back pain. Skin: Negative for rash. Neurological: Negative for headaches, focal weakness or numbness. Endocrine:Hypertension   ____________________________________________   PHYSICAL EXAM:  VITAL SIGNS: ED Triage Vitals  Enc Vitals Group     BP 10/16/17 1950 (!) 145/97     Pulse Rate 10/16/17 1950 90     Resp 10/16/17 1950 16     Temp 10/16/17 1950 98.7 F (37.1 C)     Temp Source 10/16/17 1950 Oral     SpO2 10/16/17 1950 98 %     Weight 10/16/17 1950 253 lb (114.8 kg)     Height 10/16/17 1950 6' (1.829 m)     Head Circumference --      Peak Flow --      Pain Score 10/16/17 2028 9     Pain Loc --      Pain Edu? --  Excl. in GC? --    Constitutional: Alert and oriented. Well appearing and in no acute distress. Neck: No stridor.  No cervical spine tenderness to palpation. Hematological/Lymphatic/Immunilogical: No cervical lymphadenopathy. Cardiovascular: Normal rate, regular rhythm. Grossly normal heart sounds.  Good peripheral circulation. Respiratory: Normal respiratory effort.  No retractions. Lungs CTAB. Gastrointestinal: Soft and nontender. No distention. No abdominal bruits. No CVA tenderness. Musculoskeletal: Patient has moderate guarding palpation inferior no scapular area. Patient is decreased range of motion limited by complaining of pain  with adduction and overhead reaching. Patient struggled against resistance 3/5 compared to the right upper extremity. Neurologic:  Normal speech and language. No gross focal neurologic deficits are appreciated. No gait instability. Skin:  Skin is warm, dry and intact. No rash noted. Psychiatric: Mood and affect are normal. Speech and behavior are normal.  ____________________________________________   LABS (all labs ordered are listed, but only abnormal results are displayed)  Labs Reviewed - No data to display ____________________________________________  EKG   ____________________________________________  RADIOLOGY  No results found.  ____________________________________________   PROCEDURES  Procedure(s) performed: None  Procedures  Critical Care performed: No  ____________________________________________   INITIAL IMPRESSION / ASSESSMENT AND PLAN / ED COURSE  As part of my medical decision making, I reviewed the following data within the electronic MEDICAL RECORD NUMBER    Mid and upper back pain secondary to left scapular strain. Patient given discharge Instructions. Patient given a work note. Patient advised take medication as directed and [facility].      ____________________________________________   FINAL CLINICAL IMPRESSION(S) / ED DIAGNOSES  Final diagnoses:  Muscle strain     ED Discharge Orders        Ordered    cyclobenzaprine (FLEXERIL) 10 MG tablet  3 times daily PRN     10/16/17 2052    ibuprofen (ADVIL,MOTRIN) 800 MG tablet  Every 8 hours PRN     10/16/17 2052       Note:  This document was prepared using Dragon voice recognition software and may include unintentional dictation errors.    Joni ReiningSmith, Bethani Brugger K, PA-C 10/16/17 [id number]    Jeanmarie PlantMcShane, James A, MD 10/16/17 2101

## 2017-10-16 NOTE — ED Triage Notes (Signed)
Pt states "my back is locked up on me". Pt complains of left sided mid to upper back pain that is worse with movement. Pt is ambulatory without difficulty. Pt denies other symptoms. Pt appears in no acute distress.

## 2017-10-16 NOTE — ED Provider Notes (Signed)
EKG interpreted by me Artifact in the inferior leads, reportedly due to lack of patient cooperation with EKG. Normal sinus rhythm rate of 98, normal axis intervals QRS ST segments and T waves   Sharman CheekStafford, Elyanna Wallick, MD 10/16/17 2005

## 2018-12-11 ENCOUNTER — Emergency Department
Admission: EM | Admit: 2018-12-11 | Discharge: 2018-12-11 | Disposition: A | Discharge status: Home / Self Care | Payer: Self-pay | Attending: Emergency Medicine | Admitting: Emergency Medicine

## 2018-12-11 ENCOUNTER — Encounter: Payer: Self-pay | Admitting: Emergency Medicine

## 2018-12-11 ENCOUNTER — Emergency Department: Payer: Self-pay

## 2018-12-11 DIAGNOSIS — M25571 Pain in right ankle and joints of right foot: Secondary | ICD-10-CM | POA: Insufficient documentation

## 2018-12-11 DIAGNOSIS — F17210 Nicotine dependence, cigarettes, uncomplicated: Secondary | ICD-10-CM | POA: Insufficient documentation

## 2018-12-11 DIAGNOSIS — Z79899 Other long term (current) drug therapy: Secondary | ICD-10-CM | POA: Insufficient documentation

## 2018-12-11 DIAGNOSIS — E119 Type 2 diabetes mellitus without complications: Secondary | ICD-10-CM | POA: Insufficient documentation

## 2018-12-11 DIAGNOSIS — I10 Essential (primary) hypertension: Secondary | ICD-10-CM | POA: Insufficient documentation

## 2018-12-11 MED ORDER — IBUPROFEN 600 MG PO TABS: 600.0000 mg | ORAL_TABLET | Freq: Four times a day (QID) | ORAL | 0 refills | Status: AC | PRN

## 2018-12-11 MED ORDER — IBUPROFEN 600 MG PO TABS
600.0000 mg | ORAL_TABLET | Freq: Once | ORAL | Status: AC
  Administered 2018-12-11: 600 mg via ORAL
  Filled 2018-12-11: qty 1

## 2018-12-11 MED ORDER — TRAMADOL HCL 50 MG PO TABS: 50.0000 mg | ORAL_TABLET | Freq: Four times a day (QID) | ORAL | 0 refills | Status: AC | PRN

## 2018-12-11 NOTE — Discharge Instructions (Signed)
Take the ibuprofen every 6 hours for the next several days to decrease inflammation.  You can take the tramadol if needed for more severe pain although you should try to use it as seldom as possible and not while you are driving or operating heavy machinery.  Follow-up with your orthopedist in [facility] as discussed.  Return to the ER for new or worsening pain, swelling, weakness or numbness, or any other new or worsening symptoms that concern you.

## 2018-12-11 NOTE — ED Triage Notes (Signed)
Patient states that he had surgery on his right ankle in 2009 and has a pin left in his ankle. Patient states that over the past 2 days he has had increased pain and swelling to his ankle.

## 2018-12-11 NOTE — ED Provider Notes (Signed)
[facility] Emergency Department Provider Note ____________________________________________   First MD Initiated Contact with Patient 12/11/18 [phone]     (approximate)  I have reviewed the triage vital signs and the nursing notes.   HISTORY  Chief Complaint Leg Pain    HPI Lawrence BirksDavid N Besse Jr. is a 38 y.o. male with PMH as noted below as well as ORIF to the right ankle in 2009 who presents with right ankle pain, acute onset over the last few days, and associated with swelling.  The patient denies any specific trauma or injury.  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  He denies any other acute joint pain.  Past Medical History:  Diagnosis Date  . Diabetes mellitus without complication (HCC)   . Hypertension     There are no active problems to display for this patient.   Past Surgical History:  Procedure Laterality Date  . ANKLE SURGERY     Bilateral    Prior to Admission medications   Medication Sig Start Date End Date Taking? Authorizing Provider  clindamycin (CLEOCIN) 300 MG capsule Take 1 capsule (300 mg total) by mouth 3 (three) times daily. 07/01/15   Rebecka ApleyWebster, Allison P, MD  cyclobenzaprine (FLEXERIL) 10 MG tablet Take 1 tablet (10 mg total) 3 (three) times daily as needed by mouth. 10/16/17   Joni ReiningSmith, Ronald K, PA-C  ibuprofen (ADVIL,MOTRIN) 600 MG tablet Take 1 tablet (600 mg total) by mouth every 6 (six) hours as needed. 12/11/18   Dionne BucySiadecki, Karandeep Resende, MD  ibuprofen (ADVIL,MOTRIN) 800 MG tablet Take 1 tablet (800 mg total) every 8 (eight) hours as needed by mouth for moderate pain. 10/16/17   Joni ReiningSmith, Ronald K, PA-C  nabumetone (RELAFEN) 750 MG tablet Take 1 tablet (750 mg total) by mouth 2 (two) times daily. 06/30/16   Menshew, Charlesetta IvoryJenise V Bacon, PA-C  naproxen (NAPROSYN) 500 MG tablet Take 1 tablet (500 mg total) by mouth 2 (two) times daily with a meal. 03/22/17   Irean HongSung, Jade J, MD  oxyCODONE-acetaminophen (ROXICET) 5-325 MG  per tablet Take 1 tablet by mouth every 6 (six) hours as needed. 07/01/15   Rebecka ApleyWebster, Allison P, MD  traMADol (ULTRAM) 50 MG tablet Take 1 tablet (50 mg total) by mouth every 6 (six) hours as needed for up to 5 days for severe pain. 12/11/18 12/16/18  Dionne BucySiadecki, Debbora Ang, MD    Allergies Patient has no known allergies.  No family history on file.  Social History Social History   Tobacco Use  . Smoking status: Current Every Day Smoker  . Smokeless tobacco: Never Used  Substance Use Topics  . Alcohol use: No  . Drug use: No    Review of Systems  Constitutional: No fever. Gastrointestinal: No vomiting. Musculoskeletal: Positive for ankle pain. Skin: Negative for rash. Neurological: Negative for focal weakness or numbness.   ____________________________________________   PHYSICAL EXAM:  VITAL SIGNS: ED Triage Vitals  Enc Vitals Group     BP 12/11/18 0237 (!) 159/100     Pulse Rate 12/11/18 0237 84     Resp 12/11/18 0237 18     Temp 12/11/18 0237 98.2 F (36.8 C)     Temp Source 12/11/18 0237 Oral     SpO2 12/11/18 0237 97 %     Weight 12/11/18 0238 245 lb (111.1 kg)     Height 12/11/18 0238 6' (1.829 m)     Head Circumference --      Peak Flow --  Pain Score 12/11/18 0237 10     Pain Loc --      Pain Edu? --      Excl. in GC? --     Constitutional: Alert and oriented. Well appearing and in no acute distress. Eyes: Conjunctivae are normal.  Head: Atraumatic. Nose: No congestion/rhinnorhea. Mouth/Throat: Mucous membranes are moist.   Neck: Normal range of motion.  Cardiovascular: Normal rate, regular rhythm. Good peripheral circulation. Respiratory: Normal respiratory effort.  Gastrointestinal: No distention.  Musculoskeletal: No lower extremity edema.  Extremities warm and well perfused.  Right ankle with minimal effusion.  Tender laterally.  No deformity.  Full range of motion at the ankle.  2+ DP pulse. Neurologic:  Normal speech and language. No gross  focal neurologic deficits are appreciated.  Right lower extremity motor and sensory intact. Skin:  Skin is warm and dry. No rash noted. Psychiatric: Mood and affect are normal. Speech and behavior are normal.  ____________________________________________   LABS (all labs ordered are listed, but only abnormal results are displayed)  Labs Reviewed - No data to display ____________________________________________  EKG   ____________________________________________  RADIOLOGY  XR R tib/fib: Postoperative changes of right ankle.  The ostial reaction in proximal tibial metaphysis  ____________________________________________   PROCEDURES  Procedure(s) performed: No  Procedures  Critical Care performed: No ____________________________________________   INITIAL IMPRESSION / ASSESSMENT AND PLAN / ED COURSE  Pertinent labs & imaging results that were available during my care of the patient were reviewed by me and considered in my medical decision making (see chart for details).  38 year old male with prior history of ORIF to right ankle presents with acute pain and mild swelling there over the last several days with no specific injury.  On exam the patient is well-appearing and his vital signs are normal except for hypertension.  He has mild swelling to the right ankle and some tenderness especially laterally but no deformity.  There is no erythema or induration.  The right lower extremity is neuro/vascular intact.  X-ray shows no acute findings in the ankle.  There is apparent periosteal reaction in the proximal tibia but the patient has no pain or tenderness there so this is likely not clinically significant.  Overall presentation is consistent with sprain or possibly acute arthritis flare related to overuse.  There is no indication for further work-up or acute intervention.  I will prescribe the patient analgesia.  He plans to follow-up with his orthopedist at [facility] who  had done the surgery (he was waiting for his insurance to kick back in on January 1).  I gave the patient RICE instructions.  Return precautions given, and he expresses understanding.  ____________________________________________   FINAL CLINICAL IMPRESSION(S) / ED DIAGNOSES  Final diagnoses:  Acute right ankle pain      NEW MEDICATIONS STARTED DURING THIS VISIT:  New Prescriptions   IBUPROFEN (ADVIL,MOTRIN) 600 MG TABLET    Take 1 tablet (600 mg total) by mouth every 6 (six) hours as needed.   TRAMADOL (ULTRAM) 50 MG TABLET    Take 1 tablet (50 mg total) by mouth every 6 (six) hours as needed for up to 5 days for severe pain.     Note:  This document was prepared using Dragon voice recognition software and may include unintentional dictation errors.    Dionne Bucy, MD 12/11/18 [phone]
